# Patient Record
Sex: Female | Born: 1935 | Race: White | Hispanic: No | Smoking: Current every day smoker
Health system: Southern US, Community
[De-identification: ages and names within clinical notes are randomized; demographics above are authoritative.]

## PROBLEM LIST (undated history)

## (undated) DIAGNOSIS — L97909 Non-pressure chronic ulcer of unspecified part of unspecified lower leg with unspecified severity: Secondary | ICD-10-CM

## (undated) DIAGNOSIS — M1990 Unspecified osteoarthritis, unspecified site: Secondary | ICD-10-CM

## (undated) DIAGNOSIS — I70209 Unspecified atherosclerosis of native arteries of extremities, unspecified extremity: Secondary | ICD-10-CM

## (undated) DIAGNOSIS — Z720 Tobacco use: Secondary | ICD-10-CM

## (undated) DIAGNOSIS — L98499 Non-pressure chronic ulcer of skin of other sites with unspecified severity: Secondary | ICD-10-CM

## (undated) DIAGNOSIS — F0390 Unspecified dementia without behavioral disturbance: Secondary | ICD-10-CM

## (undated) DIAGNOSIS — I872 Venous insufficiency (chronic) (peripheral): Secondary | ICD-10-CM

## (undated) HISTORY — PX: ABDOMINAL HYSTERECTOMY: SHX81

## (undated) HISTORY — DX: Unspecified osteoarthritis, unspecified site: M19.90

## (undated) HISTORY — PX: FRACTURE SURGERY: SHX138

---

## 2012-06-07 DIAGNOSIS — Z23 Encounter for immunization: Secondary | ICD-10-CM | POA: Diagnosis not present

## 2013-05-22 DIAGNOSIS — Z23 Encounter for immunization: Secondary | ICD-10-CM | POA: Diagnosis not present

## 2014-03-10 ENCOUNTER — Other Ambulatory Visit: Payer: Self-pay | Admitting: *Deleted

## 2014-03-10 ENCOUNTER — Other Ambulatory Visit (HOSPITAL_COMMUNITY): Payer: Self-pay | Admitting: Internal Medicine

## 2014-03-10 ENCOUNTER — Ambulatory Visit (HOSPITAL_COMMUNITY)
Admission: RE | Admit: 2014-03-10 | Discharge: 2014-03-10 | Disposition: A | Discharge status: Home / Self Care | Payer: Medicare Other | Source: Ambulatory Visit | Attending: Vascular Surgery | Admitting: Vascular Surgery

## 2014-03-10 DIAGNOSIS — R21 Rash and other nonspecific skin eruption: Secondary | ICD-10-CM | POA: Diagnosis not present

## 2014-03-10 DIAGNOSIS — M79609 Pain in unspecified limb: Secondary | ICD-10-CM | POA: Insufficient documentation

## 2014-03-10 DIAGNOSIS — R0609 Other forms of dyspnea: Secondary | ICD-10-CM | POA: Diagnosis not present

## 2014-03-10 DIAGNOSIS — L0291 Cutaneous abscess, unspecified: Secondary | ICD-10-CM | POA: Diagnosis not present

## 2014-03-10 DIAGNOSIS — I739 Peripheral vascular disease, unspecified: Secondary | ICD-10-CM

## 2014-03-10 DIAGNOSIS — Z681 Body mass index (BMI) 19 or less, adult: Secondary | ICD-10-CM | POA: Diagnosis not present

## 2014-03-10 DIAGNOSIS — Z1331 Encounter for screening for depression: Secondary | ICD-10-CM | POA: Diagnosis not present

## 2014-03-10 DIAGNOSIS — R609 Edema, unspecified: Secondary | ICD-10-CM | POA: Diagnosis not present

## 2014-03-10 DIAGNOSIS — M25579 Pain in unspecified ankle and joints of unspecified foot: Secondary | ICD-10-CM | POA: Diagnosis not present

## 2014-03-10 NOTE — Progress Notes (Signed)
Preliminary ankle brachial index results given to Yolanda Martin at [facility], per Dr. Link SnufferHolwerda patient was informed they are to take one baby asprin a day and their office would be contacting them.

## 2014-03-11 ENCOUNTER — Other Ambulatory Visit (HOSPITAL_COMMUNITY): Payer: Self-pay | Admitting: Internal Medicine

## 2014-03-11 ENCOUNTER — Encounter: Payer: Self-pay | Admitting: Vascular Surgery

## 2014-03-11 ENCOUNTER — Ambulatory Visit (HOSPITAL_COMMUNITY): Payer: Self-pay

## 2014-03-11 ENCOUNTER — Other Ambulatory Visit: Payer: Self-pay

## 2014-03-11 ENCOUNTER — Ambulatory Visit (HOSPITAL_COMMUNITY)
Admission: RE | Admit: 2014-03-11 | Discharge: 2014-03-11 | Disposition: A | Discharge status: Home / Self Care | Payer: Medicare Other | Source: Ambulatory Visit | Attending: Vascular Surgery | Admitting: Vascular Surgery

## 2014-03-11 ENCOUNTER — Ambulatory Visit (INDEPENDENT_AMBULATORY_CARE_PROVIDER_SITE_OTHER): Payer: Medicare Other | Admitting: Vascular Surgery

## 2014-03-11 VITALS — BP 196/64 | HR 62 | Temp 98.2°F | Resp 16 | Ht 65.0 in | Wt 99.0 lb

## 2014-03-11 DIAGNOSIS — R609 Edema, unspecified: Secondary | ICD-10-CM

## 2014-03-11 DIAGNOSIS — I739 Peripheral vascular disease, unspecified: Secondary | ICD-10-CM | POA: Diagnosis not present

## 2014-03-11 DIAGNOSIS — L97909 Non-pressure chronic ulcer of unspecified part of unspecified lower leg with unspecified severity: Secondary | ICD-10-CM | POA: Diagnosis not present

## 2014-03-11 DIAGNOSIS — I70209 Unspecified atherosclerosis of native arteries of extremities, unspecified extremity: Secondary | ICD-10-CM | POA: Insufficient documentation

## 2014-03-11 DIAGNOSIS — L98499 Non-pressure chronic ulcer of skin of other sites with unspecified severity: Secondary | ICD-10-CM | POA: Diagnosis not present

## 2014-03-11 DIAGNOSIS — L97322 Non-pressure chronic ulcer of left ankle with fat layer exposed: Secondary | ICD-10-CM | POA: Insufficient documentation

## 2014-03-11 MED ORDER — OXYCODONE-ACETAMINOPHEN 5-325 MG PO TABS: 1.0000 | ORAL_TABLET | ORAL | Status: DC | PRN

## 2014-03-11 NOTE — Assessment & Plan Note (Signed)
This patient has 2 wounds on her left leg and evidence of significant infrainguinal arterial occlusive disease. She continues to smoke. I have had a long discussion with her about the importance of tobacco cessation. He understands that this is a risk factor for peripheral vascular disease and also affects her healing because of its affect on the microcirculation. Given her vascular disease with a nonhealing ulcer, this could become a limb threatening problem. I have recommended that we proceed with arteriography which has been scheduled for 03/16/2014. I have reviewed with the patient the indications for arteriography. In addition, I have reviewed the potential complications of arteriography including but not limited to: Bleeding, arterial injury, arterial thrombosis, dye action, renal insufficiency, or other unpredictable medical problems. I have explained to the patient that if we find disease amenable to angioplasty we could potentially address this at the same time. I have discussed the potential complications of angioplasty and stenting, including but not limited to: Bleeding, arterial thrombosis, arterial injury, dissection, or the need for surgical intervention. I will make further recommendations pending the results. In the meantime she will continue her dressing changes to the left leg. I have given her a prescription for 30 oxycodone for pain.

## 2014-03-11 NOTE — Progress Notes (Signed)
Patient ID: Yolanda QuintBetty R Llerenas, female   DOB: 1933-10-19, 80 y.o.   MRN: [id number]  Reason for Consult: PVD   Referred by Alysia PennaHolwerda, Scott, MD  Subjective:     HPI:  Yolanda Martin is a 80 y.o. female who is a very poor historian. Apparently she developed some small wounds on her left leg although it is not clear how long these have been there. They have likely been there for at least several weeks. She was sent for an arterial Doppler study which showed evidence of significant peripheral vascular disease on the left and therefore was added on as a vascular consult today.  I do not get any clear-cut history of claudication, rest pain, or previous nonhealing ulcers. She does not remember any specific injury to the left leg. She also developed a small wound on her right leg. She denies any history of DVT or phlebitis. She was just recently started on aspirin.  She denies any history of diabetes, hypertension, hypercholesterolemia, history of previous myocardial infarction, or history of congestive heart failure. She states that she's had brothers and sisters with heart disease. She does continue to smoke a pack per day has been smoking for 40 years.  Past Medical History  Diagnosis Date  . Arthritis    Family History  Problem Relation Age of Onset  . Cancer Mother   . Heart disease Mother   . Cancer Father   . Cancer Daughter    Past Surgical History  Procedure Laterality Date  . Abdominal hysterectomy     Short Social History:  History  Substance Use Topics  . Smoking status: Current Every Day Smoker -- 1.00 packs/day    Types: Cigarettes  . Smokeless tobacco: Not on file  . Alcohol Use: No   No Known Allergies  Current Outpatient Prescriptions  Medication Sig Dispense Refill  . aspirin 81 MG tablet Take 81 mg by mouth daily.      Marland Kitchen. oxyCODONE-acetaminophen (ROXICET) 5-325 MG per tablet Take 1-2 tablets by mouth every 4 (four) hours as needed for severe pain.  30 tablet  0    No current facility-administered medications for this visit.   Review of Systems  Constitutional: Negative for chills and fever.  Eyes: Negative for loss of vision.  Respiratory: Negative for cough and wheezing.  Cardiovascular: Negative for chest pain, chest tightness, claudication, dyspnea with exertion, orthopnea and palpitations.  GI: Negative for blood in stool and vomiting.  GU: Negative for dysuria and hematuria.  Musculoskeletal: Negative for leg pain, joint pain and myalgias.  Skin: Negative for rash and wound.  Neurological: Negative for dizziness and speech difficulty.  Hematologic: Negative for bruises/bleeds easily. Psychiatric: Negative for depressed mood.       Objective:  Objective  Filed Vitals:   03/11/14 1152  BP: 196/64  Pulse: 62  Temp: 98.2 F (36.8 C)  TempSrc: Oral  Resp: 16  Height: 5\' 5"  (1.651 m)  Weight: 99 lb (44.906 kg)  SpO2: 97%   Body mass index is 16.47 kg/(m^2).  Physical Exam  Constitutional: She is oriented to person, place, and time. She appears well-developed and well-nourished.  HENT:  Head: Normocephalic and atraumatic.  Neck: Neck supple. No JVD present. No thyromegaly present.  Cardiovascular: Normal rate, regular rhythm and normal heart sounds.  Exam reveals no friction rub.   No murmur heard. Pulses:      Femoral pulses are 2+ on the right side, and 2+ on the left side.  Popliteal pulses are 0 on the right side, and 0 on the left side.       Dorsalis pedis pulses are 0 on the right side, and 0 on the left side.       Posterior tibial pulses are 2+ on the right side, and 0 on the left side.  Pulmonary/Chest: Breath sounds normal. She has no wheezes. She has no rales.  Abdominal: Soft. Bowel sounds are normal. There is no tenderness.  I do not palpate an aneurysm.  Musculoskeletal: Normal range of motion. She exhibits no edema.  Lymphadenopathy:    She has no cervical adenopathy.  Neurological: She is alert and  oriented to person, place, and time. She has normal strength. No sensory deficit.  Skin: No lesion and no rash noted.  She has 2 small wounds on her left leg anteriorly. One measures 5 mm in diameter. The other measures 10 mm in diameter. She has mild bilateral lower extremity swelling and mild cellulitis. On the right leg she has 1 small wound that measures 5 mm in maximum diameter on the anterior leg.  Psychiatric: She has a normal mood and affect.   Data: I have independently interpreted her arterial Doppler study which was done in our office on 03/10/2014. Ankle-brachial index on the right was 100%. Gen. Triphasic posterior tibial signal and a biphasic dorsalis pedis signal. Toe pressure on the right was 118 mmHg.  On the left side, which is the symptomatic side, she has a monophasic dorsalis pedis and posterior tibial signal. ABI on the left is 56%. Toe pressure on the left is 68 mmHg.  Duplex of the left lower extremity showed a short segment occlusion of her left superficial femoral artery.      Assessment/Plan:     Atherosclerotic PVD with ulceration This patient has 2 wounds on her left leg and evidence of significant infrainguinal arterial occlusive disease. She continues to smoke. I have had a long discussion with her about the importance of tobacco cessation. He understands that this is a risk factor for peripheral vascular disease and also affects her healing because of its affect on the microcirculation. Given her vascular disease with a nonhealing ulcer, this could become a limb threatening problem. I have recommended that we proceed with arteriography which has been scheduled for 03/16/2014. I have reviewed with the patient the indications for arteriography. In addition, I have reviewed the potential complications of arteriography including but not limited to: Bleeding, arterial injury, arterial thrombosis, dye action, renal insufficiency, or other unpredictable medical problems. I  have explained to the patient that if we find disease amenable to angioplasty we could potentially address this at the same time. I have discussed the potential complications of angioplasty and stenting, including but not limited to: Bleeding, arterial thrombosis, arterial injury, dissection, or the need for surgical intervention. I will make further recommendations pending the results. In the meantime she will continue her dressing changes to the left leg. I have given her a prescription for 30 oxycodone for pain.      Chuck Hinthristopher S Dickson MD [facility]

## 2014-03-12 ENCOUNTER — Encounter (HOSPITAL_COMMUNITY): Payer: Self-pay | Admitting: Pharmacy Technician

## 2014-03-15 MED ORDER — SODIUM CHLORIDE 0.9 % IV SOLN
INTRAVENOUS | Status: DC
  Administered 2014-03-16: 07:00:00 via INTRAVENOUS

## 2014-03-16 ENCOUNTER — Ambulatory Visit (HOSPITAL_COMMUNITY)
Admission: RE | Admit: 2014-03-16 | Discharge: 2014-03-16 | Disposition: A | Discharge status: Home / Self Care | Payer: Medicare Other | Source: Ambulatory Visit | Attending: Vascular Surgery | Admitting: Vascular Surgery

## 2014-03-16 ENCOUNTER — Encounter (HOSPITAL_COMMUNITY)
Admission: RE | Disposition: A | Discharge status: Home / Self Care | Payer: Self-pay | Source: Ambulatory Visit | Attending: Vascular Surgery

## 2014-03-16 DIAGNOSIS — Z538 Procedure and treatment not carried out for other reasons: Secondary | ICD-10-CM | POA: Diagnosis not present

## 2014-03-16 LAB — POCT I-STAT, CHEM 8
BUN: 14 mg/dL (ref 6–23)
CALCIUM ION: 1.18 mmol/L (ref 1.13–1.30)
CREATININE: 0.8 mg/dL (ref 0.50–1.10)
Chloride: 101 mEq/L (ref 96–112)
GLUCOSE: 114 mg/dL — AB (ref 70–99)
HCT: 39 % (ref 36.0–46.0)
HEMOGLOBIN: 13.3 g/dL (ref 12.0–15.0)
Potassium: 3.3 mEq/L — ABNORMAL LOW (ref 3.7–5.3)
Sodium: 141 mEq/L (ref 137–147)
TCO2: 25 mmol/L (ref 0–100)

## 2014-03-16 SURGERY — ABDOMINAL AORTAGRAM
Anesthesia: LOCAL

## 2014-03-16 MED ORDER — HYDRALAZINE HCL 20 MG/ML IJ SOLN
10.0000 mg | Freq: Once | INTRAMUSCULAR | Status: AC
  Administered 2014-03-16: 10 mg via INTRAVENOUS

## 2014-03-16 MED ORDER — POTASSIUM CHLORIDE CRYS ER 20 MEQ PO TBCR
EXTENDED_RELEASE_TABLET | ORAL | Status: AC
  Filled 2014-03-16: qty 1

## 2014-03-16 MED ORDER — POTASSIUM CHLORIDE CRYS ER 20 MEQ PO TBCR
20.0000 meq | EXTENDED_RELEASE_TABLET | Freq: Once | ORAL | Status: AC
  Administered 2014-03-16: 20 meq via ORAL

## 2014-03-16 MED ORDER — HYDRALAZINE HCL 20 MG/ML IJ SOLN
INTRAMUSCULAR | Status: AC
  Filled 2014-03-16: qty 1

## 2014-03-18 ENCOUNTER — Telehealth: Payer: Self-pay

## 2014-03-18 DIAGNOSIS — E785 Hyperlipidemia, unspecified: Secondary | ICD-10-CM | POA: Diagnosis not present

## 2014-03-18 DIAGNOSIS — I10 Essential (primary) hypertension: Secondary | ICD-10-CM | POA: Diagnosis not present

## 2014-03-18 DIAGNOSIS — R609 Edema, unspecified: Secondary | ICD-10-CM | POA: Diagnosis not present

## 2014-03-18 NOTE — Telephone Encounter (Signed)
My first case Yolanda Martin was canceled because her blood pressure is poorly controlled. Systolic pressure was 190 despite attempts to correct this. She also complaining of some shortness of breath. Her wounds in the left leg are stable. For this reason she will need to [facility] up [name]. All she can remember is that his first name is Acupuncturistcott. When she is medically stable we can reschedule her for her arteriogram and possible PTA stent. Thank you.

## 2014-03-24 ENCOUNTER — Other Ambulatory Visit: Payer: Self-pay | Admitting: Internal Medicine

## 2014-03-24 DIAGNOSIS — Z1231 Encounter for screening mammogram for malignant neoplasm of breast: Secondary | ICD-10-CM

## 2014-03-24 DIAGNOSIS — R0609 Other forms of dyspnea: Secondary | ICD-10-CM | POA: Diagnosis not present

## 2014-03-24 DIAGNOSIS — F17200 Nicotine dependence, unspecified, uncomplicated: Secondary | ICD-10-CM | POA: Diagnosis not present

## 2014-03-24 DIAGNOSIS — L97909 Non-pressure chronic ulcer of unspecified part of unspecified lower leg with unspecified severity: Secondary | ICD-10-CM | POA: Diagnosis not present

## 2014-03-24 DIAGNOSIS — R609 Edema, unspecified: Secondary | ICD-10-CM | POA: Diagnosis not present

## 2014-03-24 DIAGNOSIS — R0989 Other specified symptoms and signs involving the circulatory and respiratory systems: Secondary | ICD-10-CM | POA: Diagnosis not present

## 2014-03-24 DIAGNOSIS — Z681 Body mass index (BMI) 19 or less, adult: Secondary | ICD-10-CM | POA: Diagnosis not present

## 2014-03-24 DIAGNOSIS — I10 Essential (primary) hypertension: Secondary | ICD-10-CM | POA: Diagnosis not present

## 2014-03-24 DIAGNOSIS — E2839 Other primary ovarian failure: Secondary | ICD-10-CM

## 2014-03-24 DIAGNOSIS — E785 Hyperlipidemia, unspecified: Secondary | ICD-10-CM | POA: Diagnosis not present

## 2014-03-24 DIAGNOSIS — I739 Peripheral vascular disease, unspecified: Secondary | ICD-10-CM | POA: Diagnosis not present

## 2014-03-25 ENCOUNTER — Other Ambulatory Visit: Payer: Self-pay | Admitting: *Deleted

## 2014-04-01 ENCOUNTER — Ambulatory Visit (HOSPITAL_COMMUNITY)
Admission: RE | Admit: 2014-04-01 | Discharge: 2014-04-01 | Disposition: A | Discharge status: Home / Self Care | Payer: Medicare Other | Source: Ambulatory Visit | Attending: Cardiology | Admitting: Cardiology

## 2014-04-01 DIAGNOSIS — R609 Edema, unspecified: Secondary | ICD-10-CM | POA: Diagnosis not present

## 2014-04-01 DIAGNOSIS — I059 Rheumatic mitral valve disease, unspecified: Secondary | ICD-10-CM

## 2014-04-01 NOTE — Progress Notes (Signed)
2D Echocardiogram Complete.  04/01/2014   Royalty Fakhouri, RDCS  

## 2014-04-05 MED ORDER — SODIUM CHLORIDE 0.9 % IV SOLN
INTRAVENOUS | Status: DC
  Administered 2014-04-06: 08:00:00 via INTRAVENOUS

## 2014-04-06 ENCOUNTER — Ambulatory Visit (HOSPITAL_COMMUNITY)
Admission: RE | Admit: 2014-04-06 | Discharge: 2014-04-06 | Disposition: A | Discharge status: Home / Self Care | Payer: Medicare Other | Source: Ambulatory Visit | Attending: Vascular Surgery | Admitting: Vascular Surgery

## 2014-04-06 ENCOUNTER — Ambulatory Visit (HOSPITAL_COMMUNITY): Payer: Medicare Other

## 2014-04-06 ENCOUNTER — Encounter (HOSPITAL_COMMUNITY)
Admission: RE | Disposition: A | Discharge status: Home / Self Care | Payer: Self-pay | Source: Ambulatory Visit | Attending: Vascular Surgery

## 2014-04-06 DIAGNOSIS — S79929A Unspecified injury of unspecified thigh, initial encounter: Secondary | ICD-10-CM | POA: Diagnosis not present

## 2014-04-06 DIAGNOSIS — Y92009 Unspecified place in unspecified non-institutional (private) residence as the place of occurrence of the external cause: Secondary | ICD-10-CM | POA: Insufficient documentation

## 2014-04-06 DIAGNOSIS — M25559 Pain in unspecified hip: Secondary | ICD-10-CM | POA: Insufficient documentation

## 2014-04-06 DIAGNOSIS — I739 Peripheral vascular disease, unspecified: Secondary | ICD-10-CM | POA: Insufficient documentation

## 2014-04-06 DIAGNOSIS — F17200 Nicotine dependence, unspecified, uncomplicated: Secondary | ICD-10-CM | POA: Insufficient documentation

## 2014-04-06 DIAGNOSIS — S79919A Unspecified injury of unspecified hip, initial encounter: Secondary | ICD-10-CM | POA: Diagnosis not present

## 2014-04-06 DIAGNOSIS — Z7982 Long term (current) use of aspirin: Secondary | ICD-10-CM | POA: Insufficient documentation

## 2014-04-06 DIAGNOSIS — W19XXXA Unspecified fall, initial encounter: Secondary | ICD-10-CM | POA: Diagnosis not present

## 2014-04-06 DIAGNOSIS — S12100A Unspecified displaced fracture of second cervical vertebra, initial encounter for closed fracture: Secondary | ICD-10-CM | POA: Insufficient documentation

## 2014-04-06 DIAGNOSIS — L98499 Non-pressure chronic ulcer of skin of other sites with unspecified severity: Principal | ICD-10-CM | POA: Insufficient documentation

## 2014-04-06 DIAGNOSIS — L97809 Non-pressure chronic ulcer of other part of unspecified lower leg with unspecified severity: Secondary | ICD-10-CM | POA: Diagnosis not present

## 2014-04-06 HISTORY — PX: ABDOMINAL AORTAGRAM: SHX5454

## 2014-04-06 LAB — POCT I-STAT, CHEM 8
BUN: 11 mg/dL (ref 6–23)
CALCIUM ION: 1.17 mmol/L (ref 1.13–1.30)
CREATININE: 0.7 mg/dL (ref 0.50–1.10)
Chloride: 100 mEq/L (ref 96–112)
GLUCOSE: 118 mg/dL — AB (ref 70–99)
HCT: 42 % (ref 36.0–46.0)
HEMOGLOBIN: 14.3 g/dL (ref 12.0–15.0)
Potassium: 3.4 mEq/L — ABNORMAL LOW (ref 3.7–5.3)
Sodium: 141 mEq/L (ref 137–147)
TCO2: 24 mmol/L (ref 0–100)

## 2014-04-06 SURGERY — ABDOMINAL AORTAGRAM
Anesthesia: LOCAL

## 2014-04-06 MED ORDER — LIDOCAINE HCL (PF) 1 % IJ SOLN
INTRAMUSCULAR | Status: AC
  Filled 2014-04-06: qty 30

## 2014-04-06 MED ORDER — SODIUM CHLORIDE 0.9 % IV SOLN: 1.0000 mL/kg/h | INTRAVENOUS | Status: DC

## 2014-04-06 MED ORDER — HYDRALAZINE HCL 20 MG/ML IJ SOLN: 10.0000 mg | INTRAMUSCULAR | Status: DC | PRN

## 2014-04-06 MED ORDER — HYDRALAZINE HCL 20 MG/ML IJ SOLN
INTRAMUSCULAR | Status: AC
  Filled 2014-04-06: qty 1

## 2014-04-06 MED ORDER — HEPARIN (PORCINE) IN NACL 2-0.9 UNIT/ML-% IJ SOLN
INTRAMUSCULAR | Status: AC
  Filled 2014-04-06: qty 1000

## 2014-04-06 NOTE — Op Note (Signed)
   PATIENT: Yolanda QuintBetty R Martin   MRN: [id number] DOB: 22-Sep-1933    DATE OF PROCEDURE: 04/06/2014  INDICATIONS: Yolanda Martin is a 80 y.o. female Who has 2 small wounds on her left leg anteriorly. By duplex she was found to have a short segment occlusion of the superficial femoral artery. She presents for arteriography and possible angioplasty and stenting.  PROCEDURE:  1. Ultrasound-guided access to the right common femoral artery 2. Aortogram bilateral iliac arteriogram  3. Selective catheterization of the left external iliac artery with left lower extremity runoff 4. Retrograde right femoral arteriogram with right lower extremity runoff  SURGEON: Di Kindlehristopher S. Edilia Boickson, MD, FACS  ANESTHESIA: local   EBL: minimal  TECHNIQUE: Patient was taken to the peripheral vascular lab. Both groins were prepped and draped in usual sterile fashion. Under ultrasound guidance, after the skin was anesthetized, the right common femoral artery was cannulated with a micropuncture needle and a micropuncture sheath introduced over a wire. This was then exchanged for a 5 [place] sheath over a [employer] wire. A pigtail catheter was positioned at the L1 vertebral body and flush aortogram obtained. The catheter was positioned above the aortic bifurcation in an oblique iliac projection was obtained. This catheter was exchanged for a crossover catheter which was positioned into the left common iliac artery. The wire was advanced into the external iliac artery and then the crossover catheter exchanged for an endhole catheter. Selective left external iliac arteriogram was obtained with left lower extremity runoff. This catheter was then removed and a retrograde right femoral arteriogram was obtained with right lower extremity runoff. At the completion, the patient was transferred to short stay for removal of the sheath.  FINDINGS:  1. Single renal arteries bilaterally with no significant renal artery stenosis. 2. Mild diffuse  disease of the infrarenal aorta and bilateral common iliac arteries but no focal stenosis. 3. On the left side, the external iliac, common femoral, deep femoral, and superficial femoral arteries are patent. 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 There are extensive collaterals present. The below-knee popliteal artery is patent with three-vessel runoff on the left via the anterior tibial, posterior tibial, and peroneal arteries. 4. On the right side, the external iliac, deep femoral, superficial femoral, popliteal, anterior tibial, posterior tibial, and peroneal arteries are patent.  CLINICAL NOTE: Face on the arteriogram results the patient appears to have adequate circulation to heal the wounds on the left leg. If these do failed to heal she could potentially have angioplasty and stenting of her popliteal occlusion although this would be associated with some risk given the proximity to the knee. The stenosis is 4 cm in length.  Waverly Ferrarihristopher Dickson, MD, FACS [facility]  DATE OF DICTATION:   04/06/2014

## 2014-04-06 NOTE — Progress Notes (Signed)
Patient ID: Georgina QuintBetty R Hass, female   DOB: September 15, 1933, 80 y.o.   MRN: [id number] The patient had complained of neck and back and hip pain following the procedure. She related a fall at home several weeks ago. Dr. Edilia Boickson ordered cervical spine and hip films. Hip films showed no evidence of problems. Her cervical spine films showed small spinal fracture was some subluxation. The patient currently is sitting in a wheelchair with her family in the short stay. I discussed this finding with them. She reports that she fell and struck her head and neck was some soreness since this time.   I discussed these events with Dr. Danielle DessElsner for neurosurgical consultation. I have ordered a stat noncontrast cervical CT scan for further evaluation. Disposition is pending Dr. Verlee RossettiElsner's  evaluation .

## 2014-04-06 NOTE — Discharge Instructions (Signed)

## 2014-04-06 NOTE — Progress Notes (Signed)
Orthopedic Tech Progress Note Patient Details:  Yolanda Martin 1933-12-16 [id number]  Ortho Devices Type of Ortho Device: Soft collar Ortho Device/Splint Interventions: Application   Asia R Thompson 04/06/2014, 4:55 PM

## 2014-04-06 NOTE — Progress Notes (Addendum)
Order for sheath removal verified per post procedural orders. Procedure explained to patient and Rt femoral artery access site assessed: level 0, doppler dorsalis pedis and posterior tibial pulses.  5 [place] Sheath removed and manual pressure applied for 20  minutes. Pre, peri, & post procedural vitals: HR  , RR 75, O2 Sat  94%  RA BP 131/60, Pain 0. Distal pulses remained intact after sheath removal. Access site level 0 and dressed with 4X4 gauze and tegaderm.   Shalo RN confirmed condition of site. Post procedural instructions discussed with return demonstration from patient.

## 2014-04-06 NOTE — Interval H&P Note (Signed)
History and Physical Interval Note:  04/06/2014 8:36 AM  Yolanda Martin  has presented today for surgery, with the diagnosis of pvd  The various methods of treatment have been discussed with the patient and family. After consideration of risks, benefits and other options for treatment, the patient has consented to  Procedure(s): ABDOMINAL AORTAGRAM (N/A) as a surgical intervention .  The patient's history has been reviewed, patient examined, no change in status, stable for surgery.  I have reviewed the patient's chart and labs.  Questions were answered to the patient's satisfaction.     Samiel Peel S

## 2014-04-06 NOTE — Progress Notes (Signed)
Pt c/o SOB, difficulty swallowing, lungs clear,  O2 sats 97 on RA.  Dr Edilia Boickson notified will be in to access pt.

## 2014-04-06 NOTE — H&P (View-Only) (Signed)
Patient ID: Yolanda Martin, female   DOB: 1933-10-19, 80 y.o.   MRN: [id number]  Reason for Consult: PVD   Referred by Alysia PennaHolwerda, Scott, MD  Subjective:     HPI:  Yolanda QuintBetty R Depaoli is a 80 y.o. female who is a very poor historian. Apparently she developed some small wounds on her left leg although it is not clear how long these have been there. They have likely been there for at least several weeks. She was sent for an arterial Doppler study which showed evidence of significant peripheral vascular disease on the left and therefore was added on as a vascular consult today.  I do not get any clear-cut history of claudication, rest pain, or previous nonhealing ulcers. She does not remember any specific injury to the left leg. She also developed a small wound on her right leg. She denies any history of DVT or phlebitis. She was just recently started on aspirin.  She denies any history of diabetes, hypertension, hypercholesterolemia, history of previous myocardial infarction, or history of congestive heart failure. She states that she's had brothers and sisters with heart disease. She does continue to smoke a pack per day has been smoking for 40 years.  Past Medical History  Diagnosis Date  . Arthritis    Family History  Problem Relation Age of Onset  . Cancer Mother   . Heart disease Mother   . Cancer Father   . Cancer Daughter    Past Surgical History  Procedure Laterality Date  . Abdominal hysterectomy     Short Social History:  History  Substance Use Topics  . Smoking status: Current Every Day Smoker -- 1.00 packs/day    Types: Cigarettes  . Smokeless tobacco: Not on file  . Alcohol Use: No   No Known Allergies  Current Outpatient Prescriptions  Medication Sig Dispense Refill  . aspirin 81 MG tablet Take 81 mg by mouth daily.      Marland Kitchen. oxyCODONE-acetaminophen (ROXICET) 5-325 MG per tablet Take 1-2 tablets by mouth every 4 (four) hours as needed for severe pain.  30 tablet  0    No current facility-administered medications for this visit.   Review of Systems  Constitutional: Negative for chills and fever.  Eyes: Negative for loss of vision.  Respiratory: Negative for cough and wheezing.  Cardiovascular: Negative for chest pain, chest tightness, claudication, dyspnea with exertion, orthopnea and palpitations.  GI: Negative for blood in stool and vomiting.  GU: Negative for dysuria and hematuria.  Musculoskeletal: Negative for leg pain, joint pain and myalgias.  Skin: Negative for rash and wound.  Neurological: Negative for dizziness and speech difficulty.  Hematologic: Negative for bruises/bleeds easily. Psychiatric: Negative for depressed mood.       Objective:  Objective  Filed Vitals:   03/11/14 1152  BP: 196/64  Pulse: 62  Temp: 98.2 F (36.8 C)  TempSrc: Oral  Resp: 16  Height: 5\' 5"  (1.651 m)  Weight: 99 lb (44.906 kg)  SpO2: 97%   Body mass index is 16.47 kg/(m^2).  Physical Exam  Constitutional: She is oriented to person, place, and time. She appears well-developed and well-nourished.  HENT:  Head: Normocephalic and atraumatic.  Neck: Neck supple. No JVD present. No thyromegaly present.  Cardiovascular: Normal rate, regular rhythm and normal heart sounds.  Exam reveals no friction rub.   No murmur heard. Pulses:      Femoral pulses are 2+ on the right side, and 2+ on the left side.  Popliteal pulses are 0 on the right side, and 0 on the left side.       Dorsalis pedis pulses are 0 on the right side, and 0 on the left side.       Posterior tibial pulses are 2+ on the right side, and 0 on the left side.  Pulmonary/Chest: Breath sounds normal. She has no wheezes. She has no rales.  Abdominal: Soft. Bowel sounds are normal. There is no tenderness.  I do not palpate an aneurysm.  Musculoskeletal: Normal range of motion. She exhibits no edema.  Lymphadenopathy:    She has no cervical adenopathy.  Neurological: She is alert and  oriented to person, place, and time. She has normal strength. No sensory deficit.  Skin: No lesion and no rash noted.  She has 2 small wounds on her left leg anteriorly. One measures 5 mm in diameter. The other measures 10 mm in diameter. She has mild bilateral lower extremity swelling and mild cellulitis. On the right leg she has 1 small wound that measures 5 mm in maximum diameter on the anterior leg.  Psychiatric: She has a normal mood and affect.   Data: I have independently interpreted her arterial Doppler study which was done in our office on 03/10/2014. Ankle-brachial index on the right was 100%. Gen. Triphasic posterior tibial signal and a biphasic dorsalis pedis signal. Toe pressure on the right was 118 mmHg.  On the left side, which is the symptomatic side, she has a monophasic dorsalis pedis and posterior tibial signal. ABI on the left is 56%. Toe pressure on the left is 68 mmHg.  Duplex of the left lower extremity showed a short segment occlusion of her left superficial femoral artery.      Assessment/Plan:     Atherosclerotic PVD with ulceration This patient has 2 wounds on her left leg and evidence of significant infrainguinal arterial occlusive disease. She continues to smoke. I have had a long discussion with her about the importance of tobacco cessation. He understands that this is a risk factor for peripheral vascular disease and also affects her healing because of its affect on the microcirculation. Given her vascular disease with a nonhealing ulcer, this could become a limb threatening problem. I have recommended that we proceed with arteriography which has been scheduled for 03/16/2014. I have reviewed with the patient the indications for arteriography. In addition, I have reviewed the potential complications of arteriography including but not limited to: Bleeding, arterial injury, arterial thrombosis, dye action, renal insufficiency, or other unpredictable medical problems. I  have explained to the patient that if we find disease amenable to angioplasty we could potentially address this at the same time. I have discussed the potential complications of angioplasty and stenting, including but not limited to: Bleeding, arterial thrombosis, arterial injury, dissection, or the need for surgical intervention. I will make further recommendations pending the results. In the meantime she will continue her dressing changes to the left leg. I have given her a prescription for 30 oxycodone for pain.      Chuck Hinthristopher S Oris Staffieri MD [facility]

## 2014-04-06 NOTE — Consult Note (Signed)
Reason for Consult: Neck pain question fracture C2 Referring Physician: Dr. Tawanna Cooler early  Yolanda Martin is an 80 y.o. female.  HPI: Yolanda Martin is a 80 year old individual who would come in for a vascular workup. She apparently sustained a fall sometime before this admission and was complaining of neck pain and body aches in various parts of her body. A C-spine x-ray was ordered because of the neck pain.  When I'm seeing the patient she seems to be down playing the neck pain and the family confirms that the patient's pain and aches are all old complaints. The plane x-rays of the cervical spine however show that there is a type II odontoid fracture with about 6 mm of anterior displacement. There is also a subluxation at C5-C6. In discussing the patient's situation she denies any numbness or tingling into the arms or hands she denies any new weakness beyond what she experiences chronically  Past Medical History  Diagnosis Date  . Arthritis     Past Surgical History  Procedure Laterality Date  . Abdominal hysterectomy      Family History  Problem Relation Age of Onset  . Cancer Mother   . Heart disease Mother   . Cancer Father   . Cancer Daughter     Social History:  reports that she has been smoking Cigarettes.  She has been smoking about 1.00 pack per day. She does not have any smokeless tobacco history on file. She reports that she does not drink alcohol or use illicit drugs.  Allergies: No Known Allergies  Medications: Have not reviewed the patient's medication  Results for orders placed during the hospital encounter of 04/06/14 (from the past 48 hour(s))  POCT I-STAT, CHEM 8     Status: Abnormal   Collection Time    04/06/14  8:25 AM      Result Value Ref Range   Sodium 141  137 - 147 mEq/L   Potassium 3.4 (*) 3.7 - 5.3 mEq/L   Chloride 100  96 - 112 mEq/L   BUN 11  6 - 23 mg/dL   Creatinine, Ser 1.61  0.50 - 1.10 mg/dL   Glucose, Bld 096 (*) 70 - 99 mg/dL   Calcium, Ion 0.45  4.09 - 1.30 mmol/L   TCO2 24  0 - 100 mmol/L   Hemoglobin 14.3  12.0 - 15.0 g/dL   HCT 81.1  91.4 - 78.2 %    Dg Cervical Spine 2 Or 3 Views  04/06/2014   CLINICAL DATA:  Fall. Neck pain particularly on the right. Arthritis.  EXAM: CERVICAL SPINE - 2-3 VIEW  COMPARISON:  None.  FINDINGS: Limited sensitivity and specificity due to positioning difficulties with the patient and bony demineralization.  Type 2 odontoid fracture with 5 mm posterior subluxation of the odontoid with respect to the C2 vertebral body.  3.5 mm anterolisthesis at C5-6, age indeterminate.  Reduced intervertebral disc height and posterior osseous ridging at C6-7.  Biapical branching opacities favoring scarring.  IMPRESSION: 1. Type 2 odontoid fracture (age indeterminate) with 5 mm posterior subluxation of the odontoid with respect to the C2 vertebral body. 2. Grade 1 anterior subluxation at C5-6 could be acute or chronic. 3. CT scan or MRI of the cervical spine is recommended for further characterization ; this two view series is limited by the patient's bony demineralization and difficulty positioning. These results will be called to the ordering clinician or representative by the Radiologist Assistant, and communication documented in the PACS or  zVision Dashboard.   Electronically Signed   By: Herbie BaltimoreWalt  Liebkemann M.D.   On: 04/06/2014 15:26   X-ray Hip Right Ap And Lateral  04/06/2014   CLINICAL DATA:  Status post fall  EXAM: RIGHT HIP - COMPLETE 2+ VIEW  COMPARISON:  None.  FINDINGS: There is no evidence of hip fracture or dislocation. There is no evidence of arthropathy or other focal bone abnormality.  IMPRESSION: Negative.   Electronically Signed   By: Elige KoHetal  Patel   On: 04/06/2014 15:24    Review of Systems  Unable to perform ROS: acuity of condition   Blood pressure 176/78, pulse 77, temperature 97.9 F (36.6 C), temperature source Oral, resp. rate 16, height 5' 5.5" (1.664 m), weight 43.092 kg (95 lb),  SpO2 98.00%. Physical Exam  Constitutional:  Frail-appearing elderly lady  Neurological:  Motor function is intact in both upper extremities. Sensation appears intact in both upper extremities. Range of motion appears fair without pain turning 45 left and right, flexing about 50% of normal.    Assessment/Plan: CT scan demonstrates that the type II odontoid fracture is clearly old. The subluxation also appears old and fixed. I've advised the patient that there are no acute injuries. Soft cervical collar may be used as needed for comfort. No specific followup is required.  Filippa Yarbough J 04/06/2014, 6:54 PM

## 2014-04-06 NOTE — Progress Notes (Signed)
Dr Edilia Boickson in to access pt, orders for hip xray and neck xray placed.  Will continue to monitor

## 2014-04-07 ENCOUNTER — Telehealth: Payer: Self-pay | Admitting: Vascular Surgery

## 2014-04-07 NOTE — Telephone Encounter (Addendum)
Message copied by Fredrich BirksMILLIKAN, DANA P on Tue Apr 07, 2014  1:49 PM ------      Message from: Chuck HintICKSON, CHRISTOPHER S      Created: Mon Apr 06, 2014 12:02 PM      Regarding: wound care Jonita Albeeden       The family is requesting that we get the pt an apt with the [place] care [place] in [place] concerning her left leg wounds.      Thanks      Thayer Ohmhris  ------  04/07/14: spoke with Kathie RhodesBetty to notify her that I had placed a referral [facility]. She had a lot of questions about pricing. I advised her that she would need to speak with their office about pricing when they called to set up her appointment. dpm

## 2014-04-07 NOTE — Telephone Encounter (Addendum)
Message copied by Fredrich BirksMILLIKAN, DANA P on Tue Apr 07, 2014  1:50 PM ------      Message from: Lorin MercyMCCHESNEY, MARILYN K      Created: Mon Apr 06, 2014 10:58 AM      Regarding: Schedule                   ----- Message -----         From: Chuck Hinthristopher S Dickson, MD         Sent: 04/06/2014  10:19 AM           To: Vvs Charge Pool      Subject: charge and follow up                                     PROCEDURE:       1. Ultrasound-guided access to the right common femoral artery      2. Aortogram bilateral iliac arteriogram       3. Selective catheterization of the left external iliac artery with left lower extremity runoff      4. Retrograde right femoral arteriogram with right lower extremity runoff            SURGEON: Di Kindlehristopher S. Edilia Boickson, MD, FACS            She will need a follow up visit in 2 months to check on her left leg wounds. Thank you.CD ------  04/07/14: patient notified of appt, dpm

## 2014-04-21 DIAGNOSIS — L02419 Cutaneous abscess of limb, unspecified: Secondary | ICD-10-CM | POA: Diagnosis not present

## 2014-04-21 DIAGNOSIS — M129 Arthropathy, unspecified: Secondary | ICD-10-CM | POA: Diagnosis not present

## 2014-04-21 DIAGNOSIS — I739 Peripheral vascular disease, unspecified: Secondary | ICD-10-CM | POA: Diagnosis not present

## 2014-04-21 DIAGNOSIS — Z8249 Family history of ischemic heart disease and other diseases of the circulatory system: Secondary | ICD-10-CM | POA: Diagnosis not present

## 2014-04-21 DIAGNOSIS — L97929 Non-pressure chronic ulcer of unspecified part of left lower leg with unspecified severity: Secondary | ICD-10-CM | POA: Diagnosis not present

## 2014-04-21 DIAGNOSIS — L97809 Non-pressure chronic ulcer of other part of unspecified lower leg with unspecified severity: Secondary | ICD-10-CM | POA: Diagnosis not present

## 2014-04-21 DIAGNOSIS — I83219 Varicose veins of right lower extremity with both ulcer of unspecified site and inflammation: Secondary | ICD-10-CM | POA: Diagnosis not present

## 2014-04-21 DIAGNOSIS — I872 Venous insufficiency (chronic) (peripheral): Secondary | ICD-10-CM | POA: Diagnosis not present

## 2014-04-21 DIAGNOSIS — Z7982 Long term (current) use of aspirin: Secondary | ICD-10-CM | POA: Diagnosis not present

## 2014-04-21 DIAGNOSIS — F17200 Nicotine dependence, unspecified, uncomplicated: Secondary | ICD-10-CM | POA: Diagnosis not present

## 2014-04-21 DIAGNOSIS — Z79899 Other long term (current) drug therapy: Secondary | ICD-10-CM | POA: Diagnosis not present

## 2014-04-21 DIAGNOSIS — I10 Essential (primary) hypertension: Secondary | ICD-10-CM | POA: Diagnosis not present

## 2014-04-28 DIAGNOSIS — I872 Venous insufficiency (chronic) (peripheral): Secondary | ICD-10-CM | POA: Diagnosis not present

## 2014-04-28 DIAGNOSIS — L97929 Non-pressure chronic ulcer of unspecified part of left lower leg with unspecified severity: Secondary | ICD-10-CM | POA: Diagnosis not present

## 2014-04-28 DIAGNOSIS — I8390 Asymptomatic varicose veins of unspecified lower extremity: Secondary | ICD-10-CM | POA: Diagnosis not present

## 2014-04-28 DIAGNOSIS — L97809 Non-pressure chronic ulcer of other part of unspecified lower leg with unspecified severity: Secondary | ICD-10-CM | POA: Diagnosis not present

## 2014-04-28 DIAGNOSIS — I83219 Varicose veins of right lower extremity with both ulcer of unspecified site and inflammation: Secondary | ICD-10-CM | POA: Diagnosis not present

## 2014-04-28 DIAGNOSIS — L02419 Cutaneous abscess of limb, unspecified: Secondary | ICD-10-CM | POA: Diagnosis not present

## 2014-04-30 ENCOUNTER — Ambulatory Visit
Admission: RE | Admit: 2014-04-30 | Discharge: 2014-04-30 | Disposition: A | Discharge status: Home / Self Care | Payer: Medicare Other | Source: Ambulatory Visit | Attending: Internal Medicine | Admitting: Internal Medicine

## 2014-04-30 DIAGNOSIS — M810 Age-related osteoporosis without current pathological fracture: Secondary | ICD-10-CM | POA: Diagnosis not present

## 2014-04-30 DIAGNOSIS — Z1231 Encounter for screening mammogram for malignant neoplasm of breast: Secondary | ICD-10-CM | POA: Diagnosis not present

## 2014-04-30 DIAGNOSIS — E2839 Other primary ovarian failure: Secondary | ICD-10-CM

## 2014-05-06 DIAGNOSIS — I87319 Chronic venous hypertension (idiopathic) with ulcer of unspecified lower extremity: Secondary | ICD-10-CM | POA: Diagnosis not present

## 2014-05-06 DIAGNOSIS — L03119 Cellulitis of unspecified part of limb: Secondary | ICD-10-CM | POA: Diagnosis not present

## 2014-05-06 DIAGNOSIS — L97909 Non-pressure chronic ulcer of unspecified part of unspecified lower leg with unspecified severity: Secondary | ICD-10-CM | POA: Diagnosis not present

## 2014-05-06 DIAGNOSIS — I8390 Asymptomatic varicose veins of unspecified lower extremity: Secondary | ICD-10-CM | POA: Diagnosis not present

## 2014-05-06 DIAGNOSIS — L97809 Non-pressure chronic ulcer of other part of unspecified lower leg with unspecified severity: Secondary | ICD-10-CM | POA: Diagnosis not present

## 2014-05-06 DIAGNOSIS — I83009 Varicose veins of unspecified lower extremity with ulcer of unspecified site: Secondary | ICD-10-CM | POA: Diagnosis not present

## 2014-05-06 DIAGNOSIS — I872 Venous insufficiency (chronic) (peripheral): Secondary | ICD-10-CM | POA: Diagnosis not present

## 2014-05-06 DIAGNOSIS — L02419 Cutaneous abscess of limb, unspecified: Secondary | ICD-10-CM | POA: Diagnosis not present

## 2014-05-13 DIAGNOSIS — I8390 Asymptomatic varicose veins of unspecified lower extremity: Secondary | ICD-10-CM | POA: Diagnosis not present

## 2014-05-13 DIAGNOSIS — L97909 Non-pressure chronic ulcer of unspecified part of unspecified lower leg with unspecified severity: Secondary | ICD-10-CM | POA: Diagnosis not present

## 2014-05-13 DIAGNOSIS — L02419 Cutaneous abscess of limb, unspecified: Secondary | ICD-10-CM | POA: Diagnosis not present

## 2014-05-13 DIAGNOSIS — I83009 Varicose veins of unspecified lower extremity with ulcer of unspecified site: Secondary | ICD-10-CM | POA: Diagnosis not present

## 2014-05-13 DIAGNOSIS — I87319 Chronic venous hypertension (idiopathic) with ulcer of unspecified lower extremity: Secondary | ICD-10-CM | POA: Diagnosis not present

## 2014-05-13 DIAGNOSIS — L97809 Non-pressure chronic ulcer of other part of unspecified lower leg with unspecified severity: Secondary | ICD-10-CM | POA: Diagnosis not present

## 2014-05-13 DIAGNOSIS — I872 Venous insufficiency (chronic) (peripheral): Secondary | ICD-10-CM | POA: Diagnosis not present

## 2014-05-20 DIAGNOSIS — L97809 Non-pressure chronic ulcer of other part of unspecified lower leg with unspecified severity: Secondary | ICD-10-CM | POA: Diagnosis not present

## 2014-05-20 DIAGNOSIS — I872 Venous insufficiency (chronic) (peripheral): Secondary | ICD-10-CM | POA: Diagnosis not present

## 2014-05-20 DIAGNOSIS — I8390 Asymptomatic varicose veins of unspecified lower extremity: Secondary | ICD-10-CM | POA: Diagnosis not present

## 2014-05-20 DIAGNOSIS — L02419 Cutaneous abscess of limb, unspecified: Secondary | ICD-10-CM | POA: Diagnosis not present

## 2014-05-28 DIAGNOSIS — I83012 Varicose veins of right lower extremity with ulcer of calf: Secondary | ICD-10-CM | POA: Diagnosis not present

## 2014-05-28 DIAGNOSIS — L97221 Non-pressure chronic ulcer of left calf limited to breakdown of skin: Secondary | ICD-10-CM | POA: Diagnosis not present

## 2014-05-28 DIAGNOSIS — Z09 Encounter for follow-up examination after completed treatment for conditions other than malignant neoplasm: Secondary | ICD-10-CM | POA: Diagnosis not present

## 2014-05-28 DIAGNOSIS — Z872 Personal history of diseases of the skin and subcutaneous tissue: Secondary | ICD-10-CM | POA: Diagnosis not present

## 2014-05-28 DIAGNOSIS — L97211 Non-pressure chronic ulcer of right calf limited to breakdown of skin: Secondary | ICD-10-CM | POA: Diagnosis not present

## 2014-05-28 DIAGNOSIS — I83022 Varicose veins of left lower extremity with ulcer of calf: Secondary | ICD-10-CM | POA: Diagnosis not present

## 2014-06-09 ENCOUNTER — Encounter: Payer: Self-pay | Admitting: Vascular Surgery

## 2014-06-10 ENCOUNTER — Encounter: Payer: Medicare Other | Admitting: Vascular Surgery

## 2014-06-30 ENCOUNTER — Encounter: Payer: Self-pay | Admitting: Vascular Surgery

## 2014-07-01 ENCOUNTER — Ambulatory Visit (INDEPENDENT_AMBULATORY_CARE_PROVIDER_SITE_OTHER): Payer: Medicare Other | Admitting: Vascular Surgery

## 2014-07-01 ENCOUNTER — Encounter: Payer: Self-pay | Admitting: Vascular Surgery

## 2014-07-01 VITALS — BP 194/79 | HR 60 | Ht 65.5 in | Wt 105.3 lb

## 2014-07-01 DIAGNOSIS — I10 Essential (primary) hypertension: Secondary | ICD-10-CM

## 2014-07-01 DIAGNOSIS — I70209 Unspecified atherosclerosis of native arteries of extremities, unspecified extremity: Secondary | ICD-10-CM

## 2014-07-01 DIAGNOSIS — L98499 Non-pressure chronic ulcer of skin of other sites with unspecified severity: Secondary | ICD-10-CM

## 2014-07-01 DIAGNOSIS — I739 Peripheral vascular disease, unspecified: Secondary | ICD-10-CM | POA: Diagnosis not present

## 2014-07-01 NOTE — Progress Notes (Signed)
   Patient name: Yolanda Martin Colonna MRN: [id number] DOB: 1933/10/12 Sex: female  REASON FOR VISIT: Follow up of left lower extremity wound  HPI: Yolanda Martin Lefeber is a 80 y.o. female who comes in for follow up after her recent arteriogram. She had presented with 2 small wounds on her left leg anteriorly. She underwent an arteriogram on 04/06/2014. This showed no significant aortoiliac occlusive disease. 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 There were extensive collaterals present. The below knee popliteal artery was patent with three-vessel runoff on the left area I felt that she likely had adequate circulation to heal the wounds on her left leg. She comes in for a follow up visit.  The wounds on her left leg have now completely healed. She has no specific complaints.  REVIEW OF SYSTEMS: Arly.Keller[X ] denotes positive finding; [  ] denotes negative finding  CARDIOVASCULAR:  [ ]  chest pain   [ ]  dyspnea on exertion    CONSTITUTIONAL:  [ ]  fever   [ ]  chills  PHYSICAL EXAM: Filed Vitals:   07/01/14 1306  BP: 194/79  Pulse: 60  Height: 5' 5.5" (1.664 m)  Weight: 105 lb 4.8 oz (47.764 kg)  SpO2: 98%   Body mass index is 17.25 kg/(m^2). GENERAL: The patient is a well-nourished female, in no acute distress. The vital signs are documented above. CARDIOVASCULAR: There is a regular rate and rhythm. She has palpable femoral pulses.  Both feet are warm and well perfused. PULMONARY: There is good air exchange bilaterally without wheezing or rales. The wounds on her left leg have completely healed.  MEDICAL ISSUES:  Atherosclerotic PVD with ulceration The wounds on her left leg and now completely healed. I have encouraged her to stay as active as possible. Fortunately she is not a smoker. I've ordered follow up ABIs in 6 months and we'll see her back at that time. She knows to call sooner if she has  problems.  Essential hypertension, benign Her initial blood pressure in the office today was 194/79. We repeated it before she left and it was down to 177/69. I have asked her to follow up with her primary medical doctor concerning her hypertension.   DICKSON,CHRISTOPHER S [facility]: [phone]

## 2014-07-01 NOTE — Assessment & Plan Note (Signed)
The wounds on her left leg and now completely healed. I have encouraged her to stay as active as possible. Fortunately she is not a smoker. I've ordered follow up ABIs in 6 months and we'll see her back at that time. She knows to call sooner if she has problems.

## 2014-07-01 NOTE — Addendum Note (Signed)
Addended by: Sharee PimpleMCCHESNEY, Adea Geisel K on: 07/01/2014 02:09 PM   Modules accepted: Orders

## 2014-07-01 NOTE — Assessment & Plan Note (Signed)
Her initial blood pressure in the office today was 194/79. We repeated it before she left and it was down to 177/69. I have asked her to follow up with her primary medical doctor concerning her hypertension.

## 2014-08-06 ENCOUNTER — Encounter (HOSPITAL_COMMUNITY): Payer: Self-pay | Admitting: Vascular Surgery

## 2014-08-12 ENCOUNTER — Telehealth: Payer: Self-pay

## 2014-08-12 NOTE — Telephone Encounter (Addendum)
Phone call from pt.  Reported she is having problems with her legs again; stated they are running water.  Was going to the Wound Center, but stated her wounds healed and she was discharged from there.  Reported swelling in bilateral LE's from feet to a few inches below the knees.  Also stated she is having trouble with her breathing.  Reported she has had shortness of breath for a long time.  Stated that sometimes she has to just sit down, because her heart feels funny.  Pt. Was advised she should call her PCP regarding her breathing and heart symptoms.  Pt. Continued to bring conversation back to her legs, and stated, "I need to have my legs looked at."  Advised pt. will call her back to schedule appt. for eval. of her legs, but to call her PCP today, and request to speak to a nurse regarding her SOB and symptoms with her heart.  Pt. stated she would call her PCP.

## 2014-08-13 ENCOUNTER — Other Ambulatory Visit: Payer: Self-pay | Admitting: *Deleted

## 2014-08-13 ENCOUNTER — Encounter: Payer: Self-pay | Admitting: Family

## 2014-08-13 DIAGNOSIS — I739 Peripheral vascular disease, unspecified: Secondary | ICD-10-CM

## 2014-08-14 ENCOUNTER — Ambulatory Visit: Payer: Medicare Other | Admitting: Family

## 2014-08-14 ENCOUNTER — Encounter (HOSPITAL_COMMUNITY): Payer: Medicare Other

## 2014-08-14 ENCOUNTER — Encounter: Payer: Self-pay | Admitting: Family

## 2014-08-14 ENCOUNTER — Ambulatory Visit (INDEPENDENT_AMBULATORY_CARE_PROVIDER_SITE_OTHER): Payer: Medicare Other | Admitting: Family

## 2014-08-14 ENCOUNTER — Ambulatory Visit (HOSPITAL_COMMUNITY)
Admission: RE | Admit: 2014-08-14 | Discharge: 2014-08-14 | Disposition: A | Discharge status: Home / Self Care | Payer: Medicare Other | Source: Ambulatory Visit | Attending: Family | Admitting: Family

## 2014-08-14 VITALS — BP 150/78 | HR 60 | Resp 14 | Ht 65.5 in | Wt 97.0 lb

## 2014-08-14 DIAGNOSIS — Z681 Body mass index (BMI) 19 or less, adult: Secondary | ICD-10-CM | POA: Diagnosis not present

## 2014-08-14 DIAGNOSIS — L98499 Non-pressure chronic ulcer of skin of other sites with unspecified severity: Secondary | ICD-10-CM

## 2014-08-14 DIAGNOSIS — R609 Edema, unspecified: Secondary | ICD-10-CM

## 2014-08-14 DIAGNOSIS — I509 Heart failure, unspecified: Secondary | ICD-10-CM

## 2014-08-14 DIAGNOSIS — I10 Essential (primary) hypertension: Secondary | ICD-10-CM | POA: Diagnosis not present

## 2014-08-14 DIAGNOSIS — Z1389 Encounter for screening for other disorder: Secondary | ICD-10-CM | POA: Diagnosis not present

## 2014-08-14 DIAGNOSIS — I739 Peripheral vascular disease, unspecified: Secondary | ICD-10-CM

## 2014-08-14 DIAGNOSIS — J449 Chronic obstructive pulmonary disease, unspecified: Secondary | ICD-10-CM | POA: Diagnosis not present

## 2014-08-14 DIAGNOSIS — I70209 Unspecified atherosclerosis of native arteries of extremities, unspecified extremity: Secondary | ICD-10-CM

## 2014-08-14 DIAGNOSIS — R600 Localized edema: Secondary | ICD-10-CM | POA: Diagnosis not present

## 2014-08-14 DIAGNOSIS — M810 Age-related osteoporosis without current pathological fracture: Secondary | ICD-10-CM | POA: Diagnosis not present

## 2014-08-14 DIAGNOSIS — R351 Nocturia: Secondary | ICD-10-CM | POA: Diagnosis not present

## 2014-08-14 NOTE — Progress Notes (Signed)
Postoperative Visit   History of Present Illness  Yolanda Martin is a 80 y.o. female who patient of Dr. Edilia Boickson who is s/p arteriogram on 04/06/2014. This showed no significant aortoiliac occlusive disease. 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 There were extensive collaterals present. The below knee popliteal artery was patent with three-vessel runoff on the left area and Dr. Edilia Boickson felt that she likely had adequate circulation to heal the wounds on her left leg.    The pt returns today with c/o wounds in lower legs resuming since she stopped receiving wound care [place].   She states that she elevates her legs when she is not busy working in her house, but states she is busy working in her house much of the time. She denies fever or chills. Pt states she was diagnosed with CHF and this is why her legs have fluid collecting.  ROS: See HPI for pertinent positives and negatives   Past Surgical History  Procedure Laterality Date  . Abdominal hysterectomy    . Abdominal aortagram N/A 04/06/2014    Procedure: ABDOMINAL Yolanda FlurryAORTAGRAM;  Surgeon: Chuck Hinthristopher S Dickson, MD;  [facility];  Service: Cardiovascular;  Laterality: N/A;   Past Medical History  Diagnosis Date  . Arthritis    History   Social History  . Marital Status: Married    Spouse Name: N/A    Number of Children: N/A  . Years of Education: N/A   Occupational History  . Not on file.   Social History Main Topics  . Smoking status: Current Every Day Smoker -- 1.00 packs/day    Types: Cigarettes  . Smokeless tobacco: Not on file  . Alcohol Use: No  . Drug Use: No  . Sexual Activity: Not on file   Other Topics Concern  . Not on file   Social History Narrative   Family History  Problem Relation Age of Onset  . Cancer Mother   . Heart disease Mother   . Cancer Father   . Cancer  Daughter    Current Outpatient Prescriptions on File Prior to Visit  Medication Sig Dispense Refill  . aspirin 81 MG tablet Take 81 mg by mouth daily.    Marland Kitchen. atorvastatin (LIPITOR) 20 MG tablet Take 1 tablet by mouth daily.  2  . furosemide (LASIX) 20 MG tablet Take 1 tablet by mouth daily.  0  . oxyCODONE-acetaminophen (ROXICET) 5-325 MG per tablet Take 1-2 tablets by mouth every 4 (four) hours as needed for severe pain. 30 tablet 0   No current facility-administered medications on file prior to visit.   No Known Allergies   Physical Examination  Filed Vitals:   08/14/14 1445  BP: 150/78  Pulse: 60  Resp: 14  Height: 5' 5.5" (1.664 m)  Weight: 97 lb (43.999 kg)   Body mass index is 15.89 kg/(m^2).  PHYSICAL EXAMINATION: General: The patient appears their stated age.   HEENT:  No gross abnormalities Pulmonary: Respirations are non-labored Abdomen: Soft and non-tender. Musculoskeletal: There are no major deformities.   Neurologic: No focal weakness or paresthesias are detected, Skin: Lower left epidermal layer deep ulcers, no erythema, no purulence. 1+ pitting edema right lower leg, 2+ left lower leg Psychiatric: The patient has normal affect. Cardiovascular: There is a regular rate and rhythm.  Vascular: Vessel Right Left  Radial Palpable Palpable  Aorta Not palpable N/A  Femoral Palpable Palpable  Popliteal Not palpable Not palpable  PT not Palpable not Palpable  DP not Palpable  Palpable    Medical Decision Making  Yolanda QuintBetty R Marchi is a 80 y.o. female who is s/p arteriogram on 04/06/2014. This showed no significant aortoiliac occlusive disease. 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 There were extensive collaterals present. The below knee popliteal artery was patent with three-vessel runoff on the left area and Dr. Edilia Boickson felt that she likely had adequate circulation to  heal the wounds on her left leg.  Dr. Edilia Boickson spoke with pt and husband and examined pt. Bilateral LE edema secondary to CHF and pt likely not elevating legs very much.  No non invasive vascular lab testing necessary today. Xeroform dressings to open wounds on lower legs, kerlex to both lower legs, ace compression wraps to both lower legs. [facility].  Elevation of legs above heart as much as possible. Graduated knee high compression hose 20-30 mm Hg once wounds are healed, wear during the day. Follow up with Dr. Edilia Boickson in 3 months with ABI's.    NICKEL, Yolanda LairSUZANNE L, RN, MSN, FNP-C [facility] [place] Office: [phone]  08/14/2014, 3:15 PM  Clinic MD: Edilia Boickson

## 2014-09-02 ENCOUNTER — Ambulatory Visit: Payer: Medicare Other | Admitting: Family

## 2014-09-02 ENCOUNTER — Encounter (HOSPITAL_COMMUNITY): Payer: Medicare Other

## 2014-09-10 ENCOUNTER — Encounter (HOSPITAL_COMMUNITY): Payer: Self-pay | Admitting: Vascular Surgery

## 2014-11-01 DIAGNOSIS — Z96642 Presence of left artificial hip joint: Secondary | ICD-10-CM | POA: Diagnosis not present

## 2014-11-01 DIAGNOSIS — S72042A Displaced fracture of base of neck of left femur, initial encounter for closed fracture: Secondary | ICD-10-CM | POA: Diagnosis not present

## 2014-11-01 DIAGNOSIS — J849 Interstitial pulmonary disease, unspecified: Secondary | ICD-10-CM | POA: Diagnosis not present

## 2014-11-01 DIAGNOSIS — T796XXA Traumatic ischemia of muscle, initial encounter: Secondary | ICD-10-CM | POA: Diagnosis not present

## 2014-11-01 DIAGNOSIS — F99 Mental disorder, not otherwise specified: Secondary | ICD-10-CM | POA: Diagnosis not present

## 2014-11-01 DIAGNOSIS — D62 Acute posthemorrhagic anemia: Secondary | ICD-10-CM | POA: Diagnosis not present

## 2014-11-01 DIAGNOSIS — J449 Chronic obstructive pulmonary disease, unspecified: Secondary | ICD-10-CM | POA: Diagnosis not present

## 2014-11-01 DIAGNOSIS — J9621 Acute and chronic respiratory failure with hypoxia: Secondary | ICD-10-CM | POA: Diagnosis not present

## 2014-11-01 DIAGNOSIS — S72002A Fracture of unspecified part of neck of left femur, initial encounter for closed fracture: Secondary | ICD-10-CM | POA: Diagnosis not present

## 2014-11-01 DIAGNOSIS — W1830XA Fall on same level, unspecified, initial encounter: Secondary | ICD-10-CM | POA: Diagnosis not present

## 2014-11-01 DIAGNOSIS — Z9181 History of falling: Secondary | ICD-10-CM | POA: Diagnosis not present

## 2014-11-01 DIAGNOSIS — W133XXA Fall through floor, initial encounter: Secondary | ICD-10-CM | POA: Diagnosis not present

## 2014-11-01 DIAGNOSIS — R05 Cough: Secondary | ICD-10-CM | POA: Diagnosis not present

## 2014-11-01 DIAGNOSIS — L03116 Cellulitis of left lower limb: Secondary | ICD-10-CM | POA: Diagnosis not present

## 2014-11-01 DIAGNOSIS — N390 Urinary tract infection, site not specified: Secondary | ICD-10-CM | POA: Diagnosis not present

## 2014-11-01 DIAGNOSIS — R278 Other lack of coordination: Secondary | ICD-10-CM | POA: Diagnosis not present

## 2014-11-01 DIAGNOSIS — Z7401 Bed confinement status: Secondary | ICD-10-CM | POA: Diagnosis not present

## 2014-11-01 DIAGNOSIS — M25552 Pain in left hip: Secondary | ICD-10-CM | POA: Diagnosis not present

## 2014-11-01 DIAGNOSIS — B9620 Unspecified Escherichia coli [E. coli] as the cause of diseases classified elsewhere: Secondary | ICD-10-CM | POA: Diagnosis present

## 2014-11-01 DIAGNOSIS — E8809 Other disorders of plasma-protein metabolism, not elsewhere classified: Secondary | ICD-10-CM | POA: Diagnosis present

## 2014-11-01 DIAGNOSIS — S72009A Fracture of unspecified part of neck of unspecified femur, initial encounter for closed fracture: Secondary | ICD-10-CM | POA: Diagnosis not present

## 2014-11-01 DIAGNOSIS — I10 Essential (primary) hypertension: Secondary | ICD-10-CM | POA: Diagnosis present

## 2014-11-01 DIAGNOSIS — M14852 Arthropathies in other specified diseases classified elsewhere, left hip: Secondary | ICD-10-CM | POA: Diagnosis not present

## 2014-11-01 DIAGNOSIS — S72012A Unspecified intracapsular fracture of left femur, initial encounter for closed fracture: Secondary | ICD-10-CM | POA: Diagnosis not present

## 2014-11-01 DIAGNOSIS — Z79899 Other long term (current) drug therapy: Secondary | ICD-10-CM | POA: Diagnosis not present

## 2014-11-01 DIAGNOSIS — J984 Other disorders of lung: Secondary | ICD-10-CM | POA: Diagnosis not present

## 2014-11-01 DIAGNOSIS — Z471 Aftercare following joint replacement surgery: Secondary | ICD-10-CM | POA: Diagnosis not present

## 2014-11-01 DIAGNOSIS — J90 Pleural effusion, not elsewhere classified: Secondary | ICD-10-CM | POA: Diagnosis not present

## 2014-11-01 DIAGNOSIS — M810 Age-related osteoporosis without current pathological fracture: Secondary | ICD-10-CM | POA: Diagnosis present

## 2014-11-01 DIAGNOSIS — I509 Heart failure, unspecified: Secondary | ICD-10-CM | POA: Diagnosis present

## 2014-11-01 DIAGNOSIS — B964 Proteus (mirabilis) (morganii) as the cause of diseases classified elsewhere: Secondary | ICD-10-CM | POA: Diagnosis present

## 2014-11-01 DIAGNOSIS — M6281 Muscle weakness (generalized): Secondary | ICD-10-CM | POA: Diagnosis not present

## 2014-11-01 DIAGNOSIS — D649 Anemia, unspecified: Secondary | ICD-10-CM | POA: Diagnosis not present

## 2014-11-01 DIAGNOSIS — Z9981 Dependence on supplemental oxygen: Secondary | ICD-10-CM | POA: Diagnosis not present

## 2014-11-05 DIAGNOSIS — N959 Unspecified menopausal and perimenopausal disorder: Secondary | ICD-10-CM | POA: Diagnosis not present

## 2014-11-05 DIAGNOSIS — M6281 Muscle weakness (generalized): Secondary | ICD-10-CM | POA: Diagnosis not present

## 2014-11-05 DIAGNOSIS — M14852 Arthropathies in other specified diseases classified elsewhere, left hip: Secondary | ICD-10-CM | POA: Diagnosis not present

## 2014-11-05 DIAGNOSIS — Z9981 Dependence on supplemental oxygen: Secondary | ICD-10-CM | POA: Diagnosis not present

## 2014-11-05 DIAGNOSIS — J84115 Respiratory bronchiolitis interstitial lung disease: Secondary | ICD-10-CM | POA: Diagnosis not present

## 2014-11-05 DIAGNOSIS — R05 Cough: Secondary | ICD-10-CM | POA: Diagnosis not present

## 2014-11-05 DIAGNOSIS — Z96642 Presence of left artificial hip joint: Secondary | ICD-10-CM | POA: Diagnosis not present

## 2014-11-05 DIAGNOSIS — Z7401 Bed confinement status: Secondary | ICD-10-CM | POA: Diagnosis not present

## 2014-11-05 DIAGNOSIS — F99 Mental disorder, not otherwise specified: Secondary | ICD-10-CM | POA: Diagnosis not present

## 2014-11-05 DIAGNOSIS — Z471 Aftercare following joint replacement surgery: Secondary | ICD-10-CM | POA: Diagnosis not present

## 2014-11-05 DIAGNOSIS — S72009A Fracture of unspecified part of neck of unspecified femur, initial encounter for closed fracture: Secondary | ICD-10-CM | POA: Diagnosis not present

## 2014-11-05 DIAGNOSIS — S72002A Fracture of unspecified part of neck of left femur, initial encounter for closed fracture: Secondary | ICD-10-CM | POA: Diagnosis not present

## 2014-11-05 DIAGNOSIS — D649 Anemia, unspecified: Secondary | ICD-10-CM | POA: Diagnosis not present

## 2014-11-05 DIAGNOSIS — J449 Chronic obstructive pulmonary disease, unspecified: Secondary | ICD-10-CM | POA: Diagnosis not present

## 2014-11-05 DIAGNOSIS — R278 Other lack of coordination: Secondary | ICD-10-CM | POA: Diagnosis not present

## 2014-11-05 DIAGNOSIS — F329 Major depressive disorder, single episode, unspecified: Secondary | ICD-10-CM | POA: Diagnosis not present

## 2014-11-05 DIAGNOSIS — J984 Other disorders of lung: Secondary | ICD-10-CM | POA: Diagnosis not present

## 2014-11-05 DIAGNOSIS — L03116 Cellulitis of left lower limb: Secondary | ICD-10-CM | POA: Diagnosis not present

## 2014-11-13 DIAGNOSIS — S72002A Fracture of unspecified part of neck of left femur, initial encounter for closed fracture: Secondary | ICD-10-CM | POA: Diagnosis not present

## 2014-11-17 DIAGNOSIS — Z96642 Presence of left artificial hip joint: Secondary | ICD-10-CM | POA: Diagnosis not present

## 2014-11-18 ENCOUNTER — Ambulatory Visit: Payer: Medicare Other | Admitting: Vascular Surgery

## 2014-11-18 ENCOUNTER — Encounter (HOSPITAL_COMMUNITY): Payer: Medicare Other

## 2014-12-15 DIAGNOSIS — Z96642 Presence of left artificial hip joint: Secondary | ICD-10-CM | POA: Diagnosis not present

## 2014-12-15 DIAGNOSIS — S72002A Fracture of unspecified part of neck of left femur, initial encounter for closed fracture: Secondary | ICD-10-CM | POA: Diagnosis not present

## 2014-12-30 ENCOUNTER — Ambulatory Visit: Payer: Medicare Other | Admitting: Vascular Surgery

## 2014-12-30 ENCOUNTER — Encounter (HOSPITAL_COMMUNITY): Payer: Medicare Other

## 2015-01-17 DIAGNOSIS — J449 Chronic obstructive pulmonary disease, unspecified: Secondary | ICD-10-CM | POA: Diagnosis not present

## 2015-01-17 DIAGNOSIS — F329 Major depressive disorder, single episode, unspecified: Secondary | ICD-10-CM | POA: Diagnosis not present

## 2015-01-17 DIAGNOSIS — N959 Unspecified menopausal and perimenopausal disorder: Secondary | ICD-10-CM | POA: Diagnosis not present

## 2015-01-17 DIAGNOSIS — J84115 Respiratory bronchiolitis interstitial lung disease: Secondary | ICD-10-CM | POA: Diagnosis not present

## 2015-01-22 DIAGNOSIS — J449 Chronic obstructive pulmonary disease, unspecified: Secondary | ICD-10-CM | POA: Diagnosis not present

## 2015-01-22 DIAGNOSIS — D649 Anemia, unspecified: Secondary | ICD-10-CM | POA: Diagnosis not present

## 2015-01-22 DIAGNOSIS — S81802D Unspecified open wound, left lower leg, subsequent encounter: Secondary | ICD-10-CM | POA: Diagnosis not present

## 2015-01-22 DIAGNOSIS — R54 Age-related physical debility: Secondary | ICD-10-CM | POA: Diagnosis not present

## 2015-01-22 DIAGNOSIS — J9691 Respiratory failure, unspecified with hypoxia: Secondary | ICD-10-CM | POA: Diagnosis not present

## 2015-01-22 DIAGNOSIS — Z96649 Presence of unspecified artificial hip joint: Secondary | ICD-10-CM | POA: Diagnosis not present

## 2015-01-22 DIAGNOSIS — F99 Mental disorder, not otherwise specified: Secondary | ICD-10-CM | POA: Diagnosis not present

## 2015-01-22 DIAGNOSIS — S7292XD Unspecified fracture of left femur, subsequent encounter for closed fracture with routine healing: Secondary | ICD-10-CM | POA: Diagnosis not present

## 2015-01-26 DIAGNOSIS — R54 Age-related physical debility: Secondary | ICD-10-CM | POA: Diagnosis not present

## 2015-01-26 DIAGNOSIS — S81802D Unspecified open wound, left lower leg, subsequent encounter: Secondary | ICD-10-CM | POA: Diagnosis not present

## 2015-01-26 DIAGNOSIS — J9691 Respiratory failure, unspecified with hypoxia: Secondary | ICD-10-CM | POA: Diagnosis not present

## 2015-01-26 DIAGNOSIS — J449 Chronic obstructive pulmonary disease, unspecified: Secondary | ICD-10-CM | POA: Diagnosis not present

## 2015-01-26 DIAGNOSIS — S7292XD Unspecified fracture of left femur, subsequent encounter for closed fracture with routine healing: Secondary | ICD-10-CM | POA: Diagnosis not present

## 2015-01-26 DIAGNOSIS — F99 Mental disorder, not otherwise specified: Secondary | ICD-10-CM | POA: Diagnosis not present

## 2015-01-27 DIAGNOSIS — S72002A Fracture of unspecified part of neck of left femur, initial encounter for closed fracture: Secondary | ICD-10-CM | POA: Diagnosis not present

## 2015-01-27 DIAGNOSIS — Z96642 Presence of left artificial hip joint: Secondary | ICD-10-CM | POA: Diagnosis not present

## 2015-01-28 DIAGNOSIS — M25552 Pain in left hip: Secondary | ICD-10-CM | POA: Diagnosis not present

## 2015-01-28 DIAGNOSIS — J440 Chronic obstructive pulmonary disease with acute lower respiratory infection: Secondary | ICD-10-CM | POA: Diagnosis not present

## 2015-01-28 DIAGNOSIS — J849 Interstitial pulmonary disease, unspecified: Secondary | ICD-10-CM | POA: Diagnosis not present

## 2015-01-28 DIAGNOSIS — I5032 Chronic diastolic (congestive) heart failure: Secondary | ICD-10-CM | POA: Diagnosis not present

## 2015-01-28 DIAGNOSIS — I4891 Unspecified atrial fibrillation: Secondary | ICD-10-CM | POA: Diagnosis not present

## 2015-01-29 DIAGNOSIS — F99 Mental disorder, not otherwise specified: Secondary | ICD-10-CM | POA: Diagnosis not present

## 2015-01-29 DIAGNOSIS — S81802D Unspecified open wound, left lower leg, subsequent encounter: Secondary | ICD-10-CM | POA: Diagnosis not present

## 2015-01-29 DIAGNOSIS — J9691 Respiratory failure, unspecified with hypoxia: Secondary | ICD-10-CM | POA: Diagnosis not present

## 2015-01-29 DIAGNOSIS — J449 Chronic obstructive pulmonary disease, unspecified: Secondary | ICD-10-CM | POA: Diagnosis not present

## 2015-01-29 DIAGNOSIS — R54 Age-related physical debility: Secondary | ICD-10-CM | POA: Diagnosis not present

## 2015-01-29 DIAGNOSIS — S7292XD Unspecified fracture of left femur, subsequent encounter for closed fracture with routine healing: Secondary | ICD-10-CM | POA: Diagnosis not present

## 2015-01-30 DIAGNOSIS — F99 Mental disorder, not otherwise specified: Secondary | ICD-10-CM | POA: Diagnosis not present

## 2015-01-30 DIAGNOSIS — S81802D Unspecified open wound, left lower leg, subsequent encounter: Secondary | ICD-10-CM | POA: Diagnosis not present

## 2015-01-30 DIAGNOSIS — S7292XD Unspecified fracture of left femur, subsequent encounter for closed fracture with routine healing: Secondary | ICD-10-CM | POA: Diagnosis not present

## 2015-01-30 DIAGNOSIS — J9691 Respiratory failure, unspecified with hypoxia: Secondary | ICD-10-CM | POA: Diagnosis not present

## 2015-01-30 DIAGNOSIS — J449 Chronic obstructive pulmonary disease, unspecified: Secondary | ICD-10-CM | POA: Diagnosis not present

## 2015-01-30 DIAGNOSIS — R54 Age-related physical debility: Secondary | ICD-10-CM | POA: Diagnosis not present

## 2015-02-02 DIAGNOSIS — S81802D Unspecified open wound, left lower leg, subsequent encounter: Secondary | ICD-10-CM | POA: Diagnosis not present

## 2015-02-02 DIAGNOSIS — F99 Mental disorder, not otherwise specified: Secondary | ICD-10-CM | POA: Diagnosis not present

## 2015-02-02 DIAGNOSIS — S7292XD Unspecified fracture of left femur, subsequent encounter for closed fracture with routine healing: Secondary | ICD-10-CM | POA: Diagnosis not present

## 2015-02-02 DIAGNOSIS — J449 Chronic obstructive pulmonary disease, unspecified: Secondary | ICD-10-CM | POA: Diagnosis not present

## 2015-02-02 DIAGNOSIS — J9691 Respiratory failure, unspecified with hypoxia: Secondary | ICD-10-CM | POA: Diagnosis not present

## 2015-02-02 DIAGNOSIS — R54 Age-related physical debility: Secondary | ICD-10-CM | POA: Diagnosis not present

## 2015-02-03 DIAGNOSIS — F99 Mental disorder, not otherwise specified: Secondary | ICD-10-CM | POA: Diagnosis not present

## 2015-02-03 DIAGNOSIS — R54 Age-related physical debility: Secondary | ICD-10-CM | POA: Diagnosis not present

## 2015-02-03 DIAGNOSIS — J9691 Respiratory failure, unspecified with hypoxia: Secondary | ICD-10-CM | POA: Diagnosis not present

## 2015-02-03 DIAGNOSIS — S81802D Unspecified open wound, left lower leg, subsequent encounter: Secondary | ICD-10-CM | POA: Diagnosis not present

## 2015-02-03 DIAGNOSIS — J449 Chronic obstructive pulmonary disease, unspecified: Secondary | ICD-10-CM | POA: Diagnosis not present

## 2015-02-03 DIAGNOSIS — S7292XD Unspecified fracture of left femur, subsequent encounter for closed fracture with routine healing: Secondary | ICD-10-CM | POA: Diagnosis not present

## 2015-02-04 DIAGNOSIS — D649 Anemia, unspecified: Secondary | ICD-10-CM | POA: Diagnosis not present

## 2015-02-04 DIAGNOSIS — R54 Age-related physical debility: Secondary | ICD-10-CM | POA: Diagnosis not present

## 2015-02-04 DIAGNOSIS — S7292XD Unspecified fracture of left femur, subsequent encounter for closed fracture with routine healing: Secondary | ICD-10-CM | POA: Diagnosis not present

## 2015-02-04 DIAGNOSIS — Z96649 Presence of unspecified artificial hip joint: Secondary | ICD-10-CM | POA: Diagnosis not present

## 2015-02-04 DIAGNOSIS — S81802D Unspecified open wound, left lower leg, subsequent encounter: Secondary | ICD-10-CM | POA: Diagnosis not present

## 2015-02-04 DIAGNOSIS — F99 Mental disorder, not otherwise specified: Secondary | ICD-10-CM | POA: Diagnosis not present

## 2015-02-04 DIAGNOSIS — J449 Chronic obstructive pulmonary disease, unspecified: Secondary | ICD-10-CM | POA: Diagnosis not present

## 2015-02-04 DIAGNOSIS — J9691 Respiratory failure, unspecified with hypoxia: Secondary | ICD-10-CM | POA: Diagnosis not present

## 2015-02-05 DIAGNOSIS — L0390 Cellulitis, unspecified: Secondary | ICD-10-CM | POA: Diagnosis not present

## 2015-02-06 DIAGNOSIS — S81802D Unspecified open wound, left lower leg, subsequent encounter: Secondary | ICD-10-CM | POA: Diagnosis not present

## 2015-02-06 DIAGNOSIS — J9691 Respiratory failure, unspecified with hypoxia: Secondary | ICD-10-CM | POA: Diagnosis not present

## 2015-02-06 DIAGNOSIS — S7292XD Unspecified fracture of left femur, subsequent encounter for closed fracture with routine healing: Secondary | ICD-10-CM | POA: Diagnosis not present

## 2015-02-06 DIAGNOSIS — R54 Age-related physical debility: Secondary | ICD-10-CM | POA: Diagnosis not present

## 2015-02-06 DIAGNOSIS — F99 Mental disorder, not otherwise specified: Secondary | ICD-10-CM | POA: Diagnosis not present

## 2015-02-06 DIAGNOSIS — J449 Chronic obstructive pulmonary disease, unspecified: Secondary | ICD-10-CM | POA: Diagnosis not present

## 2015-02-10 DIAGNOSIS — S7292XD Unspecified fracture of left femur, subsequent encounter for closed fracture with routine healing: Secondary | ICD-10-CM | POA: Diagnosis not present

## 2015-02-10 DIAGNOSIS — J449 Chronic obstructive pulmonary disease, unspecified: Secondary | ICD-10-CM | POA: Diagnosis not present

## 2015-02-10 DIAGNOSIS — S81802D Unspecified open wound, left lower leg, subsequent encounter: Secondary | ICD-10-CM | POA: Diagnosis not present

## 2015-02-10 DIAGNOSIS — J9691 Respiratory failure, unspecified with hypoxia: Secondary | ICD-10-CM | POA: Diagnosis not present

## 2015-02-10 DIAGNOSIS — F99 Mental disorder, not otherwise specified: Secondary | ICD-10-CM | POA: Diagnosis not present

## 2015-02-10 DIAGNOSIS — R54 Age-related physical debility: Secondary | ICD-10-CM | POA: Diagnosis not present

## 2015-02-11 DIAGNOSIS — S81802D Unspecified open wound, left lower leg, subsequent encounter: Secondary | ICD-10-CM | POA: Diagnosis not present

## 2015-02-11 DIAGNOSIS — J449 Chronic obstructive pulmonary disease, unspecified: Secondary | ICD-10-CM | POA: Diagnosis not present

## 2015-02-11 DIAGNOSIS — J9691 Respiratory failure, unspecified with hypoxia: Secondary | ICD-10-CM | POA: Diagnosis not present

## 2015-02-11 DIAGNOSIS — F99 Mental disorder, not otherwise specified: Secondary | ICD-10-CM | POA: Diagnosis not present

## 2015-02-11 DIAGNOSIS — S7292XD Unspecified fracture of left femur, subsequent encounter for closed fracture with routine healing: Secondary | ICD-10-CM | POA: Diagnosis not present

## 2015-02-11 DIAGNOSIS — R54 Age-related physical debility: Secondary | ICD-10-CM | POA: Diagnosis not present

## 2015-02-12 DIAGNOSIS — J9691 Respiratory failure, unspecified with hypoxia: Secondary | ICD-10-CM | POA: Diagnosis not present

## 2015-02-12 DIAGNOSIS — R54 Age-related physical debility: Secondary | ICD-10-CM | POA: Diagnosis not present

## 2015-02-12 DIAGNOSIS — F99 Mental disorder, not otherwise specified: Secondary | ICD-10-CM | POA: Diagnosis not present

## 2015-02-12 DIAGNOSIS — J449 Chronic obstructive pulmonary disease, unspecified: Secondary | ICD-10-CM | POA: Diagnosis not present

## 2015-02-12 DIAGNOSIS — S7292XD Unspecified fracture of left femur, subsequent encounter for closed fracture with routine healing: Secondary | ICD-10-CM | POA: Diagnosis not present

## 2015-02-12 DIAGNOSIS — S81802D Unspecified open wound, left lower leg, subsequent encounter: Secondary | ICD-10-CM | POA: Diagnosis not present

## 2015-02-15 DIAGNOSIS — S81802D Unspecified open wound, left lower leg, subsequent encounter: Secondary | ICD-10-CM | POA: Diagnosis not present

## 2015-02-15 DIAGNOSIS — J449 Chronic obstructive pulmonary disease, unspecified: Secondary | ICD-10-CM | POA: Diagnosis not present

## 2015-02-15 DIAGNOSIS — R54 Age-related physical debility: Secondary | ICD-10-CM | POA: Diagnosis not present

## 2015-02-15 DIAGNOSIS — J9691 Respiratory failure, unspecified with hypoxia: Secondary | ICD-10-CM | POA: Diagnosis not present

## 2015-02-15 DIAGNOSIS — F99 Mental disorder, not otherwise specified: Secondary | ICD-10-CM | POA: Diagnosis not present

## 2015-02-15 DIAGNOSIS — S7292XD Unspecified fracture of left femur, subsequent encounter for closed fracture with routine healing: Secondary | ICD-10-CM | POA: Diagnosis not present

## 2015-02-17 DIAGNOSIS — F99 Mental disorder, not otherwise specified: Secondary | ICD-10-CM | POA: Diagnosis not present

## 2015-02-17 DIAGNOSIS — S7292XD Unspecified fracture of left femur, subsequent encounter for closed fracture with routine healing: Secondary | ICD-10-CM | POA: Diagnosis not present

## 2015-02-17 DIAGNOSIS — J9691 Respiratory failure, unspecified with hypoxia: Secondary | ICD-10-CM | POA: Diagnosis not present

## 2015-02-17 DIAGNOSIS — J449 Chronic obstructive pulmonary disease, unspecified: Secondary | ICD-10-CM | POA: Diagnosis not present

## 2015-02-17 DIAGNOSIS — S81802D Unspecified open wound, left lower leg, subsequent encounter: Secondary | ICD-10-CM | POA: Diagnosis not present

## 2015-02-17 DIAGNOSIS — R54 Age-related physical debility: Secondary | ICD-10-CM | POA: Diagnosis not present

## 2015-02-19 DIAGNOSIS — F99 Mental disorder, not otherwise specified: Secondary | ICD-10-CM | POA: Diagnosis not present

## 2015-02-19 DIAGNOSIS — J9691 Respiratory failure, unspecified with hypoxia: Secondary | ICD-10-CM | POA: Diagnosis not present

## 2015-02-19 DIAGNOSIS — R54 Age-related physical debility: Secondary | ICD-10-CM | POA: Diagnosis not present

## 2015-02-19 DIAGNOSIS — S7292XD Unspecified fracture of left femur, subsequent encounter for closed fracture with routine healing: Secondary | ICD-10-CM | POA: Diagnosis not present

## 2015-02-19 DIAGNOSIS — S81802D Unspecified open wound, left lower leg, subsequent encounter: Secondary | ICD-10-CM | POA: Diagnosis not present

## 2015-02-19 DIAGNOSIS — J449 Chronic obstructive pulmonary disease, unspecified: Secondary | ICD-10-CM | POA: Diagnosis not present

## 2015-02-23 DIAGNOSIS — J449 Chronic obstructive pulmonary disease, unspecified: Secondary | ICD-10-CM | POA: Diagnosis not present

## 2015-02-23 DIAGNOSIS — J9691 Respiratory failure, unspecified with hypoxia: Secondary | ICD-10-CM | POA: Diagnosis not present

## 2015-02-23 DIAGNOSIS — S81802D Unspecified open wound, left lower leg, subsequent encounter: Secondary | ICD-10-CM | POA: Diagnosis not present

## 2015-02-23 DIAGNOSIS — S7292XD Unspecified fracture of left femur, subsequent encounter for closed fracture with routine healing: Secondary | ICD-10-CM | POA: Diagnosis not present

## 2015-02-23 DIAGNOSIS — R54 Age-related physical debility: Secondary | ICD-10-CM | POA: Diagnosis not present

## 2015-02-23 DIAGNOSIS — F99 Mental disorder, not otherwise specified: Secondary | ICD-10-CM | POA: Diagnosis not present

## 2015-02-25 DIAGNOSIS — S7292XD Unspecified fracture of left femur, subsequent encounter for closed fracture with routine healing: Secondary | ICD-10-CM | POA: Diagnosis not present

## 2015-02-25 DIAGNOSIS — S81802D Unspecified open wound, left lower leg, subsequent encounter: Secondary | ICD-10-CM | POA: Diagnosis not present

## 2015-02-25 DIAGNOSIS — J9691 Respiratory failure, unspecified with hypoxia: Secondary | ICD-10-CM | POA: Diagnosis not present

## 2015-02-25 DIAGNOSIS — F99 Mental disorder, not otherwise specified: Secondary | ICD-10-CM | POA: Diagnosis not present

## 2015-02-25 DIAGNOSIS — R54 Age-related physical debility: Secondary | ICD-10-CM | POA: Diagnosis not present

## 2015-02-25 DIAGNOSIS — J449 Chronic obstructive pulmonary disease, unspecified: Secondary | ICD-10-CM | POA: Diagnosis not present

## 2015-03-04 DIAGNOSIS — S7292XD Unspecified fracture of left femur, subsequent encounter for closed fracture with routine healing: Secondary | ICD-10-CM | POA: Diagnosis not present

## 2015-03-04 DIAGNOSIS — R54 Age-related physical debility: Secondary | ICD-10-CM | POA: Diagnosis not present

## 2015-03-04 DIAGNOSIS — F99 Mental disorder, not otherwise specified: Secondary | ICD-10-CM | POA: Diagnosis not present

## 2015-03-04 DIAGNOSIS — J449 Chronic obstructive pulmonary disease, unspecified: Secondary | ICD-10-CM | POA: Diagnosis not present

## 2015-03-04 DIAGNOSIS — S81802D Unspecified open wound, left lower leg, subsequent encounter: Secondary | ICD-10-CM | POA: Diagnosis not present

## 2015-03-04 DIAGNOSIS — J9691 Respiratory failure, unspecified with hypoxia: Secondary | ICD-10-CM | POA: Diagnosis not present

## 2015-03-12 DIAGNOSIS — I4891 Unspecified atrial fibrillation: Secondary | ICD-10-CM | POA: Diagnosis not present

## 2015-03-16 DIAGNOSIS — J9691 Respiratory failure, unspecified with hypoxia: Secondary | ICD-10-CM | POA: Diagnosis not present

## 2015-03-16 DIAGNOSIS — S7292XD Unspecified fracture of left femur, subsequent encounter for closed fracture with routine healing: Secondary | ICD-10-CM | POA: Diagnosis not present

## 2015-03-16 DIAGNOSIS — J449 Chronic obstructive pulmonary disease, unspecified: Secondary | ICD-10-CM | POA: Diagnosis not present

## 2015-03-16 DIAGNOSIS — F99 Mental disorder, not otherwise specified: Secondary | ICD-10-CM | POA: Diagnosis not present

## 2015-03-16 DIAGNOSIS — R54 Age-related physical debility: Secondary | ICD-10-CM | POA: Diagnosis not present

## 2015-03-16 DIAGNOSIS — S81802D Unspecified open wound, left lower leg, subsequent encounter: Secondary | ICD-10-CM | POA: Diagnosis not present

## 2015-03-18 DIAGNOSIS — I482 Chronic atrial fibrillation: Secondary | ICD-10-CM | POA: Diagnosis not present

## 2015-03-18 DIAGNOSIS — N3000 Acute cystitis without hematuria: Secondary | ICD-10-CM | POA: Diagnosis not present

## 2015-03-18 DIAGNOSIS — I10 Essential (primary) hypertension: Secondary | ICD-10-CM | POA: Diagnosis not present

## 2015-03-22 DIAGNOSIS — J9691 Respiratory failure, unspecified with hypoxia: Secondary | ICD-10-CM | POA: Diagnosis not present

## 2015-03-22 DIAGNOSIS — F99 Mental disorder, not otherwise specified: Secondary | ICD-10-CM | POA: Diagnosis not present

## 2015-03-22 DIAGNOSIS — J449 Chronic obstructive pulmonary disease, unspecified: Secondary | ICD-10-CM | POA: Diagnosis not present

## 2015-03-22 DIAGNOSIS — R54 Age-related physical debility: Secondary | ICD-10-CM | POA: Diagnosis not present

## 2015-03-22 DIAGNOSIS — S81802D Unspecified open wound, left lower leg, subsequent encounter: Secondary | ICD-10-CM | POA: Diagnosis not present

## 2015-03-22 DIAGNOSIS — S7292XD Unspecified fracture of left femur, subsequent encounter for closed fracture with routine healing: Secondary | ICD-10-CM | POA: Diagnosis not present

## 2015-03-23 DIAGNOSIS — M6281 Muscle weakness (generalized): Secondary | ICD-10-CM | POA: Diagnosis not present

## 2015-03-23 DIAGNOSIS — I10 Essential (primary) hypertension: Secondary | ICD-10-CM | POA: Diagnosis not present

## 2015-03-23 DIAGNOSIS — Z96649 Presence of unspecified artificial hip joint: Secondary | ICD-10-CM | POA: Diagnosis not present

## 2015-03-23 DIAGNOSIS — Z9181 History of falling: Secondary | ICD-10-CM | POA: Diagnosis not present

## 2015-03-23 DIAGNOSIS — F99 Mental disorder, not otherwise specified: Secondary | ICD-10-CM | POA: Diagnosis not present

## 2015-03-23 DIAGNOSIS — Z5181 Encounter for therapeutic drug level monitoring: Secondary | ICD-10-CM | POA: Diagnosis not present

## 2015-03-23 DIAGNOSIS — J449 Chronic obstructive pulmonary disease, unspecified: Secondary | ICD-10-CM | POA: Diagnosis not present

## 2015-03-23 DIAGNOSIS — Z7901 Long term (current) use of anticoagulants: Secondary | ICD-10-CM | POA: Diagnosis not present

## 2015-03-23 DIAGNOSIS — N390 Urinary tract infection, site not specified: Secondary | ICD-10-CM | POA: Diagnosis not present

## 2015-03-23 DIAGNOSIS — I2510 Atherosclerotic heart disease of native coronary artery without angina pectoris: Secondary | ICD-10-CM | POA: Diagnosis not present

## 2015-03-23 DIAGNOSIS — D649 Anemia, unspecified: Secondary | ICD-10-CM | POA: Diagnosis not present

## 2015-03-25 DIAGNOSIS — M6281 Muscle weakness (generalized): Secondary | ICD-10-CM | POA: Diagnosis not present

## 2015-03-25 DIAGNOSIS — N390 Urinary tract infection, site not specified: Secondary | ICD-10-CM | POA: Diagnosis not present

## 2015-03-25 DIAGNOSIS — J449 Chronic obstructive pulmonary disease, unspecified: Secondary | ICD-10-CM | POA: Diagnosis not present

## 2015-03-25 DIAGNOSIS — D649 Anemia, unspecified: Secondary | ICD-10-CM | POA: Diagnosis not present

## 2015-03-25 DIAGNOSIS — I10 Essential (primary) hypertension: Secondary | ICD-10-CM | POA: Diagnosis not present

## 2015-03-25 DIAGNOSIS — I2510 Atherosclerotic heart disease of native coronary artery without angina pectoris: Secondary | ICD-10-CM | POA: Diagnosis not present

## 2015-03-27 DIAGNOSIS — I10 Essential (primary) hypertension: Secondary | ICD-10-CM | POA: Diagnosis not present

## 2015-03-27 DIAGNOSIS — J449 Chronic obstructive pulmonary disease, unspecified: Secondary | ICD-10-CM | POA: Diagnosis not present

## 2015-03-27 DIAGNOSIS — M6281 Muscle weakness (generalized): Secondary | ICD-10-CM | POA: Diagnosis not present

## 2015-03-27 DIAGNOSIS — D649 Anemia, unspecified: Secondary | ICD-10-CM | POA: Diagnosis not present

## 2015-03-27 DIAGNOSIS — I2510 Atherosclerotic heart disease of native coronary artery without angina pectoris: Secondary | ICD-10-CM | POA: Diagnosis not present

## 2015-03-27 DIAGNOSIS — N390 Urinary tract infection, site not specified: Secondary | ICD-10-CM | POA: Diagnosis not present

## 2015-03-29 DIAGNOSIS — I10 Essential (primary) hypertension: Secondary | ICD-10-CM | POA: Diagnosis not present

## 2015-03-29 DIAGNOSIS — J449 Chronic obstructive pulmonary disease, unspecified: Secondary | ICD-10-CM | POA: Diagnosis not present

## 2015-03-29 DIAGNOSIS — M6281 Muscle weakness (generalized): Secondary | ICD-10-CM | POA: Diagnosis not present

## 2015-03-29 DIAGNOSIS — D649 Anemia, unspecified: Secondary | ICD-10-CM | POA: Diagnosis not present

## 2015-03-29 DIAGNOSIS — I2510 Atherosclerotic heart disease of native coronary artery without angina pectoris: Secondary | ICD-10-CM | POA: Diagnosis not present

## 2015-03-29 DIAGNOSIS — N390 Urinary tract infection, site not specified: Secondary | ICD-10-CM | POA: Diagnosis not present

## 2015-03-30 DIAGNOSIS — M6281 Muscle weakness (generalized): Secondary | ICD-10-CM | POA: Diagnosis not present

## 2015-03-30 DIAGNOSIS — D649 Anemia, unspecified: Secondary | ICD-10-CM | POA: Diagnosis not present

## 2015-03-30 DIAGNOSIS — N390 Urinary tract infection, site not specified: Secondary | ICD-10-CM | POA: Diagnosis not present

## 2015-03-30 DIAGNOSIS — I2510 Atherosclerotic heart disease of native coronary artery without angina pectoris: Secondary | ICD-10-CM | POA: Diagnosis not present

## 2015-03-30 DIAGNOSIS — I10 Essential (primary) hypertension: Secondary | ICD-10-CM | POA: Diagnosis not present

## 2015-03-30 DIAGNOSIS — J449 Chronic obstructive pulmonary disease, unspecified: Secondary | ICD-10-CM | POA: Diagnosis not present

## 2015-03-31 DIAGNOSIS — M6281 Muscle weakness (generalized): Secondary | ICD-10-CM | POA: Diagnosis not present

## 2015-03-31 DIAGNOSIS — J449 Chronic obstructive pulmonary disease, unspecified: Secondary | ICD-10-CM | POA: Diagnosis not present

## 2015-03-31 DIAGNOSIS — N390 Urinary tract infection, site not specified: Secondary | ICD-10-CM | POA: Diagnosis not present

## 2015-03-31 DIAGNOSIS — I2510 Atherosclerotic heart disease of native coronary artery without angina pectoris: Secondary | ICD-10-CM | POA: Diagnosis not present

## 2015-03-31 DIAGNOSIS — D649 Anemia, unspecified: Secondary | ICD-10-CM | POA: Diagnosis not present

## 2015-03-31 DIAGNOSIS — I10 Essential (primary) hypertension: Secondary | ICD-10-CM | POA: Diagnosis not present

## 2015-04-02 DIAGNOSIS — M6281 Muscle weakness (generalized): Secondary | ICD-10-CM | POA: Diagnosis not present

## 2015-04-02 DIAGNOSIS — I10 Essential (primary) hypertension: Secondary | ICD-10-CM | POA: Diagnosis not present

## 2015-04-02 DIAGNOSIS — N390 Urinary tract infection, site not specified: Secondary | ICD-10-CM | POA: Diagnosis not present

## 2015-04-02 DIAGNOSIS — I2510 Atherosclerotic heart disease of native coronary artery without angina pectoris: Secondary | ICD-10-CM | POA: Diagnosis not present

## 2015-04-02 DIAGNOSIS — J449 Chronic obstructive pulmonary disease, unspecified: Secondary | ICD-10-CM | POA: Diagnosis not present

## 2015-04-02 DIAGNOSIS — D649 Anemia, unspecified: Secondary | ICD-10-CM | POA: Diagnosis not present

## 2015-04-06 DIAGNOSIS — M6281 Muscle weakness (generalized): Secondary | ICD-10-CM | POA: Diagnosis not present

## 2015-04-06 DIAGNOSIS — J449 Chronic obstructive pulmonary disease, unspecified: Secondary | ICD-10-CM | POA: Diagnosis not present

## 2015-04-06 DIAGNOSIS — I2510 Atherosclerotic heart disease of native coronary artery without angina pectoris: Secondary | ICD-10-CM | POA: Diagnosis not present

## 2015-04-06 DIAGNOSIS — D649 Anemia, unspecified: Secondary | ICD-10-CM | POA: Diagnosis not present

## 2015-04-06 DIAGNOSIS — I10 Essential (primary) hypertension: Secondary | ICD-10-CM | POA: Diagnosis not present

## 2015-04-06 DIAGNOSIS — N390 Urinary tract infection, site not specified: Secondary | ICD-10-CM | POA: Diagnosis not present

## 2015-04-08 DIAGNOSIS — I10 Essential (primary) hypertension: Secondary | ICD-10-CM | POA: Diagnosis not present

## 2015-04-08 DIAGNOSIS — N390 Urinary tract infection, site not specified: Secondary | ICD-10-CM | POA: Diagnosis not present

## 2015-04-08 DIAGNOSIS — M6281 Muscle weakness (generalized): Secondary | ICD-10-CM | POA: Diagnosis not present

## 2015-04-08 DIAGNOSIS — J449 Chronic obstructive pulmonary disease, unspecified: Secondary | ICD-10-CM | POA: Diagnosis not present

## 2015-04-08 DIAGNOSIS — I2510 Atherosclerotic heart disease of native coronary artery without angina pectoris: Secondary | ICD-10-CM | POA: Diagnosis not present

## 2015-04-08 DIAGNOSIS — D649 Anemia, unspecified: Secondary | ICD-10-CM | POA: Diagnosis not present

## 2015-04-09 DIAGNOSIS — I10 Essential (primary) hypertension: Secondary | ICD-10-CM | POA: Diagnosis not present

## 2015-04-09 DIAGNOSIS — M6281 Muscle weakness (generalized): Secondary | ICD-10-CM | POA: Diagnosis not present

## 2015-04-09 DIAGNOSIS — I2510 Atherosclerotic heart disease of native coronary artery without angina pectoris: Secondary | ICD-10-CM | POA: Diagnosis not present

## 2015-04-09 DIAGNOSIS — J449 Chronic obstructive pulmonary disease, unspecified: Secondary | ICD-10-CM | POA: Diagnosis not present

## 2015-04-09 DIAGNOSIS — N390 Urinary tract infection, site not specified: Secondary | ICD-10-CM | POA: Diagnosis not present

## 2015-04-09 DIAGNOSIS — D649 Anemia, unspecified: Secondary | ICD-10-CM | POA: Diagnosis not present

## 2015-04-12 DIAGNOSIS — D649 Anemia, unspecified: Secondary | ICD-10-CM | POA: Diagnosis not present

## 2015-04-12 DIAGNOSIS — M6281 Muscle weakness (generalized): Secondary | ICD-10-CM | POA: Diagnosis not present

## 2015-04-12 DIAGNOSIS — J449 Chronic obstructive pulmonary disease, unspecified: Secondary | ICD-10-CM | POA: Diagnosis not present

## 2015-04-12 DIAGNOSIS — N390 Urinary tract infection, site not specified: Secondary | ICD-10-CM | POA: Diagnosis not present

## 2015-04-12 DIAGNOSIS — I10 Essential (primary) hypertension: Secondary | ICD-10-CM | POA: Diagnosis not present

## 2015-04-12 DIAGNOSIS — I2510 Atherosclerotic heart disease of native coronary artery without angina pectoris: Secondary | ICD-10-CM | POA: Diagnosis not present

## 2015-04-13 DIAGNOSIS — N390 Urinary tract infection, site not specified: Secondary | ICD-10-CM | POA: Diagnosis not present

## 2015-04-13 DIAGNOSIS — I2510 Atherosclerotic heart disease of native coronary artery without angina pectoris: Secondary | ICD-10-CM | POA: Diagnosis not present

## 2015-04-13 DIAGNOSIS — I10 Essential (primary) hypertension: Secondary | ICD-10-CM | POA: Diagnosis not present

## 2015-04-13 DIAGNOSIS — J449 Chronic obstructive pulmonary disease, unspecified: Secondary | ICD-10-CM | POA: Diagnosis not present

## 2015-04-13 DIAGNOSIS — D649 Anemia, unspecified: Secondary | ICD-10-CM | POA: Diagnosis not present

## 2015-04-13 DIAGNOSIS — M6281 Muscle weakness (generalized): Secondary | ICD-10-CM | POA: Diagnosis not present

## 2015-04-14 DIAGNOSIS — M6281 Muscle weakness (generalized): Secondary | ICD-10-CM | POA: Diagnosis not present

## 2015-04-14 DIAGNOSIS — N390 Urinary tract infection, site not specified: Secondary | ICD-10-CM | POA: Diagnosis not present

## 2015-04-14 DIAGNOSIS — I10 Essential (primary) hypertension: Secondary | ICD-10-CM | POA: Diagnosis not present

## 2015-04-14 DIAGNOSIS — D649 Anemia, unspecified: Secondary | ICD-10-CM | POA: Diagnosis not present

## 2015-04-14 DIAGNOSIS — I2510 Atherosclerotic heart disease of native coronary artery without angina pectoris: Secondary | ICD-10-CM | POA: Diagnosis not present

## 2015-04-14 DIAGNOSIS — J449 Chronic obstructive pulmonary disease, unspecified: Secondary | ICD-10-CM | POA: Diagnosis not present

## 2015-04-19 DIAGNOSIS — M6281 Muscle weakness (generalized): Secondary | ICD-10-CM | POA: Diagnosis not present

## 2015-04-19 DIAGNOSIS — I2510 Atherosclerotic heart disease of native coronary artery without angina pectoris: Secondary | ICD-10-CM | POA: Diagnosis not present

## 2015-04-19 DIAGNOSIS — D649 Anemia, unspecified: Secondary | ICD-10-CM | POA: Diagnosis not present

## 2015-04-19 DIAGNOSIS — J449 Chronic obstructive pulmonary disease, unspecified: Secondary | ICD-10-CM | POA: Diagnosis not present

## 2015-04-19 DIAGNOSIS — N390 Urinary tract infection, site not specified: Secondary | ICD-10-CM | POA: Diagnosis not present

## 2015-04-19 DIAGNOSIS — I10 Essential (primary) hypertension: Secondary | ICD-10-CM | POA: Diagnosis not present

## 2015-04-21 DIAGNOSIS — J449 Chronic obstructive pulmonary disease, unspecified: Secondary | ICD-10-CM | POA: Diagnosis not present

## 2015-04-21 DIAGNOSIS — I2510 Atherosclerotic heart disease of native coronary artery without angina pectoris: Secondary | ICD-10-CM | POA: Diagnosis not present

## 2015-04-21 DIAGNOSIS — N390 Urinary tract infection, site not specified: Secondary | ICD-10-CM | POA: Diagnosis not present

## 2015-04-21 DIAGNOSIS — D649 Anemia, unspecified: Secondary | ICD-10-CM | POA: Diagnosis not present

## 2015-04-21 DIAGNOSIS — I10 Essential (primary) hypertension: Secondary | ICD-10-CM | POA: Diagnosis not present

## 2015-04-21 DIAGNOSIS — M6281 Muscle weakness (generalized): Secondary | ICD-10-CM | POA: Diagnosis not present

## 2015-04-26 DIAGNOSIS — N390 Urinary tract infection, site not specified: Secondary | ICD-10-CM | POA: Diagnosis not present

## 2015-04-26 DIAGNOSIS — I2510 Atherosclerotic heart disease of native coronary artery without angina pectoris: Secondary | ICD-10-CM | POA: Diagnosis not present

## 2015-04-26 DIAGNOSIS — I10 Essential (primary) hypertension: Secondary | ICD-10-CM | POA: Diagnosis not present

## 2015-04-26 DIAGNOSIS — J449 Chronic obstructive pulmonary disease, unspecified: Secondary | ICD-10-CM | POA: Diagnosis not present

## 2015-04-26 DIAGNOSIS — M6281 Muscle weakness (generalized): Secondary | ICD-10-CM | POA: Diagnosis not present

## 2015-04-26 DIAGNOSIS — D649 Anemia, unspecified: Secondary | ICD-10-CM | POA: Diagnosis not present

## 2015-04-28 ENCOUNTER — Emergency Department (HOSPITAL_COMMUNITY)
Admission: EM | Admit: 2015-04-28 | Discharge: 2015-04-28 | Disposition: A | Discharge status: Home / Self Care | Payer: Medicare Other | Attending: Emergency Medicine | Admitting: Emergency Medicine

## 2015-04-28 ENCOUNTER — Encounter (HOSPITAL_COMMUNITY): Payer: Self-pay | Admitting: Emergency Medicine

## 2015-04-28 DIAGNOSIS — I10 Essential (primary) hypertension: Secondary | ICD-10-CM | POA: Diagnosis not present

## 2015-04-28 DIAGNOSIS — Z7901 Long term (current) use of anticoagulants: Secondary | ICD-10-CM | POA: Diagnosis not present

## 2015-04-28 DIAGNOSIS — Y9389 Activity, other specified: Secondary | ICD-10-CM | POA: Diagnosis not present

## 2015-04-28 DIAGNOSIS — J449 Chronic obstructive pulmonary disease, unspecified: Secondary | ICD-10-CM | POA: Diagnosis not present

## 2015-04-28 DIAGNOSIS — S81812A Laceration without foreign body, left lower leg, initial encounter: Secondary | ICD-10-CM

## 2015-04-28 DIAGNOSIS — Z23 Encounter for immunization: Secondary | ICD-10-CM | POA: Insufficient documentation

## 2015-04-28 DIAGNOSIS — S81802A Unspecified open wound, left lower leg, initial encounter: Secondary | ICD-10-CM | POA: Diagnosis not present

## 2015-04-28 DIAGNOSIS — W050XXA Fall from non-moving wheelchair, initial encounter: Secondary | ICD-10-CM | POA: Insufficient documentation

## 2015-04-28 DIAGNOSIS — M1990 Unspecified osteoarthritis, unspecified site: Secondary | ICD-10-CM | POA: Insufficient documentation

## 2015-04-28 DIAGNOSIS — N390 Urinary tract infection, site not specified: Secondary | ICD-10-CM | POA: Diagnosis not present

## 2015-04-28 DIAGNOSIS — Y9289 Other specified places as the place of occurrence of the external cause: Secondary | ICD-10-CM | POA: Diagnosis not present

## 2015-04-28 DIAGNOSIS — I2510 Atherosclerotic heart disease of native coronary artery without angina pectoris: Secondary | ICD-10-CM | POA: Diagnosis not present

## 2015-04-28 DIAGNOSIS — Y998 Other external cause status: Secondary | ICD-10-CM | POA: Insufficient documentation

## 2015-04-28 DIAGNOSIS — M6281 Muscle weakness (generalized): Secondary | ICD-10-CM | POA: Diagnosis not present

## 2015-04-28 DIAGNOSIS — Z720 Tobacco use: Secondary | ICD-10-CM | POA: Insufficient documentation

## 2015-04-28 DIAGNOSIS — Z5181 Encounter for therapeutic drug level monitoring: Secondary | ICD-10-CM | POA: Diagnosis not present

## 2015-04-28 DIAGNOSIS — Z79899 Other long term (current) drug therapy: Secondary | ICD-10-CM | POA: Insufficient documentation

## 2015-04-28 DIAGNOSIS — D649 Anemia, unspecified: Secondary | ICD-10-CM | POA: Diagnosis not present

## 2015-04-28 DIAGNOSIS — F99 Mental disorder, not otherwise specified: Secondary | ICD-10-CM | POA: Diagnosis not present

## 2015-04-28 MED ORDER — CEPHALEXIN 250 MG PO CAPS
250.0000 mg | ORAL_CAPSULE | Freq: Once | ORAL | Status: AC
  Administered 2015-04-28: 250 mg via ORAL
  Filled 2015-04-28: qty 1

## 2015-04-28 MED ORDER — TETANUS-DIPHTH-ACELL PERTUSSIS 5-2.5-18.5 LF-MCG/0.5 IM SUSP
0.5000 mL | Freq: Once | INTRAMUSCULAR | Status: AC
  Administered 2015-04-28: 0.5 mL via INTRAMUSCULAR
  Filled 2015-04-28: qty 0.5

## 2015-04-28 NOTE — ED Provider Notes (Signed)
CSN: [id number]     Arrival date & time 04/28/15  1248 History   First MD Initiated Contact with Patient 04/28/15 1409     Chief Complaint  Patient presents with  . Extremity Laceration    left     (Consider location/radiation/quality/duration/timing/severity/associated sxs/prior Treatment) HPI 81 year old female who has been nonambulatory since a hip fracture last year. She is living with her daughter and is in a wheelchair use. Yesterday she came forward and fell partially out of the wheelchair lacerating her right forearm her left shin. Daughter attempted to clean it with hydrogen peroxide and wound cleaner. A dressing was placed. Today the home health care nurse saw it and advised him to come to hospital for further evaluation. She denies any other injury. She did not strike her head. She is on Coumadin. She does not know when her last tetanus was. Past Medical History  Diagnosis Date  . Arthritis    Past Surgical History  Procedure Laterality Date  . Abdominal hysterectomy    . Abdominal aortagram N/A 04/06/2014    Procedure: ABDOMINAL Ronny Flurry;  Surgeon: Chuck Hint, MD;  Location: [facility] CATH LAB;  Service: Cardiovascular;  Laterality: N/A;   Family History  Problem Relation Age of Onset  . Cancer Mother   . Heart disease Mother   . Cancer Father   . Cancer Daughter    Social History  Substance Use Topics  . Smoking status: Current Every Day Smoker -- 1.00 packs/day    Types: Cigarettes  . Smokeless tobacco: None  . Alcohol Use: No   OB History    No data available     Review of Systems  All other systems reviewed and are negative.     Allergies  Review of patient's allergies indicates no known allergies.  Home Medications   Prior to Admission medications   Medication Sig Start Date End Date Taking? Authorizing Provider  ALPRAZolam Prudy Feeler) 0.5 MG tablet Take 0.25 mg by mouth 2 (two) times daily. 03/18/15  Yes Historical Provider, MD  amLODipine  (NORVASC) 5 MG tablet Take 5 mg by mouth daily. 02/21/15  Yes Historical Provider, MD  benazepril (LOTENSIN) 10 MG tablet Take 10 mg by mouth daily. 02/21/15  Yes Historical Provider, MD  Calcium-Phosphorus-Vitamin D (CALCIUM GUMMIES PO) Take 1 tablet by mouth at bedtime.   Yes Historical Provider, MD  cholecalciferol (VITAMIN D) 1000 UNITS tablet Take 1,000 Units by mouth daily.   Yes Historical Provider, MD  citalopram (CELEXA) 20 MG tablet Take 20 mg by mouth daily. 02/19/15  Yes Historical Provider, MD  furosemide (LASIX) 20 MG tablet Take 1 tablet by mouth daily. 05/10/14  Yes Historical Provider, MD  HYDROcodone-acetaminophen (NORCO/VICODIN) 5-325 MG per tablet Take 1 tablet by mouth 2 (two) times daily.  03/26/15  Yes Historical Provider, MD  methocarbamol (ROBAXIN) 500 MG tablet Take 500 mg by mouth 2 (two) times daily.   Yes Historical Provider, MD  Multiple Vitamins-Minerals (MULTIVITAMIN ADULT PO) Take 1 tablet by mouth daily.   Yes Historical Provider, MD  ranitidine (ZANTAC) 300 MG tablet Take 300 mg by mouth daily. 04/16/15  Yes Historical Provider, MD  warfarin (COUMADIN) 2 MG tablet Take 2 mg by mouth daily. 04/16/15  Yes Historical Provider, MD  oxyCODONE-acetaminophen (ROXICET) 5-325 MG per tablet Take 1-2 tablets by mouth every 4 (four) hours as needed for severe pain. Patient not taking: Reported on 04/28/2015 03/11/14   Chuck Hint, MD   BP 144/50 mmHg  Pulse 55  Temp(Src) 97.6 F (36.4 C) (Oral)  Resp 16  SpO2 95% Physical Exam  Constitutional: She is oriented to person, place, and time. She appears well-developed and well-nourished.  Chronically ill-appearing female  HENT:  Head: Normocephalic and atraumatic.  Right Ear: External ear normal.  Left Ear: External ear normal.  Nose: Nose normal.  Mouth/Throat: Oropharynx is clear and moist.  Eyes: Conjunctivae are normal. Pupils are equal, round, and reactive to light.  Neck: Normal range of motion. Neck supple.   Cardiovascular: Normal rate, regular rhythm, normal heart sounds and intact distal pulses.   Pulmonary/Chest: Effort normal and breath sounds normal.  Abdominal: Soft. Bowel sounds are normal.  Musculoskeletal:  Skin tear left lower leg anterior aspect. 4 x 5 cm area.  Neurological: She is alert and oriented to person, place, and time.  Skin: Skin is warm and dry.  Psychiatric: She has a normal mood and affect.  Nursing note and vitals reviewed.   ED Course  Procedures (including critical care time) Labs Review Labs Reviewed - No data to display  Imaging Review No results found. I have personally reviewed and evaluated these images and lab results as part of my medical decision-making.   EKG Interpretation None      MDM   Final diagnoses:  Skin tear of lower leg without complication, left, initial encounter    This is a 81 year old female skin tear to her left lower leg after a wheelchair accident yesterday. Skin is thin and wound is significant for skin being rolled back over 4-5 cm area. We will place Tegaderm and have her follow-up with primary care for recheck. She is also placed on Keflex as it is erythematous around it although she has chronic skin changes. It is not clear how well the wound was cleaned yesterday. Her tetanus is updated.  Margarita Grizzle, MD 04/28/15 [phone]

## 2015-04-28 NOTE — ED Notes (Signed)
Bed: ZO10 Expected date:  Expected time:  Means of arrival:  Comments: EMS- 80yo F, shin laceration, blood thinners, non-ambulatory

## 2015-04-28 NOTE — ED Notes (Signed)
Per ems pt from home, pt had fall yesterday slid out of her wheelchair, pt co of left leg lac . Presents with dressing that was put by home nurse today. Pt on coumadin . Per ems pt appears dry . Pt is alert and oriented . Pt is non-ambulatory since last year when she had fractured left hip.

## 2015-04-28 NOTE — Discharge Instructions (Signed)
Skin Tear Care °A skin tear is when the top layer of skin peels off. To repair the skin, your doctor may use:  °· Tape. °· Skin adhesive strips. °HOME CARE °· Change bandages (dressings) once a day or as told by your doctor. °¨ Gently clean the area with salt (saline) solution or with a mild soap and water. °¨ Do not rub the injured skin dry. Let the area air dry. °¨ Put petroleum jelly or antibiotic cream on the tear. Do not allow a scab to form. °¨ If the bandage sticks, moisten it with warm soapy water and remove it. °· Protect the injured skin until it has healed. °· Only take medicine as told by your doctor. °· Take showers or baths using warm soapy water. Apply a new bandage after the shower or bath. °· Keep all doctor visits as told. °GET HELP RIGHT AWAY IF:  °· You have redness, puffiness (swelling), or more pain in the tear. °· You have a yellowish-white fluid (pus) coming from the tear. °· You have chills. °· You have a red streak that goes away from the tear. °· You have a bad smell coming from the tear or bandage. °· You have a fever or lasting symptoms for more than 2-3 days. °· You have a fever and your symptoms suddenly get worse. °MAKE SURE YOU:  °· Understand these instructions. °· Will watch this condition. °· Will get help right away if you are not doing well or get worse. °Document Released: 05/23/2008 Document Revised: 05/08/2012 Document Reviewed: 02/26/2012 °[employer]® Patient Information ©2015 [employer]. This information is not intended to replace advice given to you by your health care provider. Make sure you discuss any questions you have with your health care provider. ° °

## 2015-04-30 DIAGNOSIS — I10 Essential (primary) hypertension: Secondary | ICD-10-CM | POA: Diagnosis not present

## 2015-04-30 DIAGNOSIS — D649 Anemia, unspecified: Secondary | ICD-10-CM | POA: Diagnosis not present

## 2015-04-30 DIAGNOSIS — M6281 Muscle weakness (generalized): Secondary | ICD-10-CM | POA: Diagnosis not present

## 2015-04-30 DIAGNOSIS — N390 Urinary tract infection, site not specified: Secondary | ICD-10-CM | POA: Diagnosis not present

## 2015-04-30 DIAGNOSIS — J449 Chronic obstructive pulmonary disease, unspecified: Secondary | ICD-10-CM | POA: Diagnosis not present

## 2015-04-30 DIAGNOSIS — I2510 Atherosclerotic heart disease of native coronary artery without angina pectoris: Secondary | ICD-10-CM | POA: Diagnosis not present

## 2015-05-04 DIAGNOSIS — L8992 Pressure ulcer of unspecified site, stage 2: Secondary | ICD-10-CM | POA: Diagnosis not present

## 2015-05-05 DIAGNOSIS — I2510 Atherosclerotic heart disease of native coronary artery without angina pectoris: Secondary | ICD-10-CM | POA: Diagnosis not present

## 2015-05-05 DIAGNOSIS — D649 Anemia, unspecified: Secondary | ICD-10-CM | POA: Diagnosis not present

## 2015-05-05 DIAGNOSIS — N390 Urinary tract infection, site not specified: Secondary | ICD-10-CM | POA: Diagnosis not present

## 2015-05-05 DIAGNOSIS — M6281 Muscle weakness (generalized): Secondary | ICD-10-CM | POA: Diagnosis not present

## 2015-05-05 DIAGNOSIS — I10 Essential (primary) hypertension: Secondary | ICD-10-CM | POA: Diagnosis not present

## 2015-05-05 DIAGNOSIS — J449 Chronic obstructive pulmonary disease, unspecified: Secondary | ICD-10-CM | POA: Diagnosis not present

## 2015-05-06 DIAGNOSIS — M6281 Muscle weakness (generalized): Secondary | ICD-10-CM | POA: Diagnosis not present

## 2015-05-06 DIAGNOSIS — D649 Anemia, unspecified: Secondary | ICD-10-CM | POA: Diagnosis not present

## 2015-05-06 DIAGNOSIS — I10 Essential (primary) hypertension: Secondary | ICD-10-CM | POA: Diagnosis not present

## 2015-05-06 DIAGNOSIS — I2510 Atherosclerotic heart disease of native coronary artery without angina pectoris: Secondary | ICD-10-CM | POA: Diagnosis not present

## 2015-05-06 DIAGNOSIS — J449 Chronic obstructive pulmonary disease, unspecified: Secondary | ICD-10-CM | POA: Diagnosis not present

## 2015-05-06 DIAGNOSIS — N390 Urinary tract infection, site not specified: Secondary | ICD-10-CM | POA: Diagnosis not present

## 2015-05-07 DIAGNOSIS — J449 Chronic obstructive pulmonary disease, unspecified: Secondary | ICD-10-CM | POA: Diagnosis not present

## 2015-05-07 DIAGNOSIS — M6281 Muscle weakness (generalized): Secondary | ICD-10-CM | POA: Diagnosis not present

## 2015-05-07 DIAGNOSIS — I2510 Atherosclerotic heart disease of native coronary artery without angina pectoris: Secondary | ICD-10-CM | POA: Diagnosis not present

## 2015-05-07 DIAGNOSIS — I10 Essential (primary) hypertension: Secondary | ICD-10-CM | POA: Diagnosis not present

## 2015-05-07 DIAGNOSIS — N390 Urinary tract infection, site not specified: Secondary | ICD-10-CM | POA: Diagnosis not present

## 2015-05-07 DIAGNOSIS — D649 Anemia, unspecified: Secondary | ICD-10-CM | POA: Diagnosis not present

## 2015-05-08 DIAGNOSIS — N390 Urinary tract infection, site not specified: Secondary | ICD-10-CM | POA: Diagnosis not present

## 2015-05-08 DIAGNOSIS — J449 Chronic obstructive pulmonary disease, unspecified: Secondary | ICD-10-CM | POA: Diagnosis not present

## 2015-05-08 DIAGNOSIS — I10 Essential (primary) hypertension: Secondary | ICD-10-CM | POA: Diagnosis not present

## 2015-05-08 DIAGNOSIS — M6281 Muscle weakness (generalized): Secondary | ICD-10-CM | POA: Diagnosis not present

## 2015-05-08 DIAGNOSIS — I2510 Atherosclerotic heart disease of native coronary artery without angina pectoris: Secondary | ICD-10-CM | POA: Diagnosis not present

## 2015-05-08 DIAGNOSIS — D649 Anemia, unspecified: Secondary | ICD-10-CM | POA: Diagnosis not present

## 2015-05-10 DIAGNOSIS — L97829 Non-pressure chronic ulcer of other part of left lower leg with unspecified severity: Secondary | ICD-10-CM | POA: Diagnosis not present

## 2015-05-10 DIAGNOSIS — I83008 Varicose veins of unspecified lower extremity with ulcer other part of lower leg: Secondary | ICD-10-CM | POA: Diagnosis present

## 2015-05-10 DIAGNOSIS — F17200 Nicotine dependence, unspecified, uncomplicated: Secondary | ICD-10-CM | POA: Diagnosis present

## 2015-05-10 DIAGNOSIS — Z8249 Family history of ischemic heart disease and other diseases of the circulatory system: Secondary | ICD-10-CM | POA: Diagnosis not present

## 2015-05-10 DIAGNOSIS — L97819 Non-pressure chronic ulcer of other part of right lower leg with unspecified severity: Secondary | ICD-10-CM | POA: Diagnosis not present

## 2015-05-10 DIAGNOSIS — J449 Chronic obstructive pulmonary disease, unspecified: Secondary | ICD-10-CM | POA: Diagnosis present

## 2015-05-10 DIAGNOSIS — I872 Venous insufficiency (chronic) (peripheral): Secondary | ICD-10-CM | POA: Diagnosis present

## 2015-05-10 DIAGNOSIS — Z7901 Long term (current) use of anticoagulants: Secondary | ICD-10-CM | POA: Diagnosis not present

## 2015-05-10 DIAGNOSIS — Z801 Family history of malignant neoplasm of trachea, bronchus and lung: Secondary | ICD-10-CM | POA: Diagnosis not present

## 2015-05-10 DIAGNOSIS — Z7401 Bed confinement status: Secondary | ICD-10-CM | POA: Diagnosis not present

## 2015-05-10 DIAGNOSIS — L03115 Cellulitis of right lower limb: Secondary | ICD-10-CM | POA: Diagnosis not present

## 2015-05-10 DIAGNOSIS — J849 Interstitial pulmonary disease, unspecified: Secondary | ICD-10-CM | POA: Diagnosis not present

## 2015-05-10 DIAGNOSIS — Z79899 Other long term (current) drug therapy: Secondary | ICD-10-CM | POA: Diagnosis not present

## 2015-05-10 DIAGNOSIS — I509 Heart failure, unspecified: Secondary | ICD-10-CM | POA: Diagnosis not present

## 2015-05-10 DIAGNOSIS — R279 Unspecified lack of coordination: Secondary | ICD-10-CM | POA: Diagnosis not present

## 2015-05-10 DIAGNOSIS — L03116 Cellulitis of left lower limb: Secondary | ICD-10-CM | POA: Diagnosis not present

## 2015-05-10 DIAGNOSIS — Z96642 Presence of left artificial hip joint: Secondary | ICD-10-CM | POA: Diagnosis present

## 2015-05-10 DIAGNOSIS — Z7951 Long term (current) use of inhaled steroids: Secondary | ICD-10-CM | POA: Diagnosis not present

## 2015-05-11 DIAGNOSIS — J849 Interstitial pulmonary disease, unspecified: Secondary | ICD-10-CM | POA: Diagnosis not present

## 2015-05-11 DIAGNOSIS — L03115 Cellulitis of right lower limb: Secondary | ICD-10-CM | POA: Diagnosis not present

## 2015-05-11 DIAGNOSIS — L97819 Non-pressure chronic ulcer of other part of right lower leg with unspecified severity: Secondary | ICD-10-CM | POA: Diagnosis not present

## 2015-05-11 DIAGNOSIS — I509 Heart failure, unspecified: Secondary | ICD-10-CM | POA: Diagnosis not present

## 2015-05-11 DIAGNOSIS — L97829 Non-pressure chronic ulcer of other part of left lower leg with unspecified severity: Secondary | ICD-10-CM | POA: Diagnosis not present

## 2015-05-11 DIAGNOSIS — L03116 Cellulitis of left lower limb: Secondary | ICD-10-CM | POA: Diagnosis not present

## 2015-05-13 DIAGNOSIS — L03116 Cellulitis of left lower limb: Secondary | ICD-10-CM | POA: Diagnosis not present

## 2015-05-13 DIAGNOSIS — R279 Unspecified lack of coordination: Secondary | ICD-10-CM | POA: Diagnosis not present

## 2015-05-13 DIAGNOSIS — L97829 Non-pressure chronic ulcer of other part of left lower leg with unspecified severity: Secondary | ICD-10-CM | POA: Diagnosis not present

## 2015-05-13 DIAGNOSIS — L97819 Non-pressure chronic ulcer of other part of right lower leg with unspecified severity: Secondary | ICD-10-CM | POA: Diagnosis not present

## 2015-05-13 DIAGNOSIS — Z7401 Bed confinement status: Secondary | ICD-10-CM | POA: Diagnosis not present

## 2015-05-13 DIAGNOSIS — J849 Interstitial pulmonary disease, unspecified: Secondary | ICD-10-CM | POA: Diagnosis not present

## 2015-05-13 DIAGNOSIS — L03115 Cellulitis of right lower limb: Secondary | ICD-10-CM | POA: Diagnosis not present

## 2015-05-17 DIAGNOSIS — Z7901 Long term (current) use of anticoagulants: Secondary | ICD-10-CM | POA: Diagnosis not present

## 2015-05-17 DIAGNOSIS — L03116 Cellulitis of left lower limb: Secondary | ICD-10-CM | POA: Diagnosis not present

## 2015-05-17 DIAGNOSIS — L97821 Non-pressure chronic ulcer of other part of left lower leg limited to breakdown of skin: Secondary | ICD-10-CM | POA: Diagnosis not present

## 2015-05-17 DIAGNOSIS — L97819 Non-pressure chronic ulcer of other part of right lower leg with unspecified severity: Secondary | ICD-10-CM | POA: Diagnosis not present

## 2015-05-17 DIAGNOSIS — J449 Chronic obstructive pulmonary disease, unspecified: Secondary | ICD-10-CM | POA: Diagnosis not present

## 2015-05-17 DIAGNOSIS — J849 Interstitial pulmonary disease, unspecified: Secondary | ICD-10-CM | POA: Diagnosis not present

## 2015-05-17 DIAGNOSIS — L97829 Non-pressure chronic ulcer of other part of left lower leg with unspecified severity: Secondary | ICD-10-CM | POA: Diagnosis not present

## 2015-05-17 DIAGNOSIS — I83218 Varicose veins of right lower extremity with both ulcer of other part of lower extremity and inflammation: Secondary | ICD-10-CM | POA: Diagnosis not present

## 2015-05-17 DIAGNOSIS — I83228 Varicose veins of left lower extremity with both ulcer of other part of lower extremity and inflammation: Secondary | ICD-10-CM | POA: Diagnosis not present

## 2015-05-17 DIAGNOSIS — I10 Essential (primary) hypertension: Secondary | ICD-10-CM | POA: Diagnosis not present

## 2015-05-17 DIAGNOSIS — F17210 Nicotine dependence, cigarettes, uncomplicated: Secondary | ICD-10-CM | POA: Diagnosis not present

## 2015-05-17 DIAGNOSIS — L03115 Cellulitis of right lower limb: Secondary | ICD-10-CM | POA: Diagnosis not present

## 2015-05-18 DIAGNOSIS — I83228 Varicose veins of left lower extremity with both ulcer of other part of lower extremity and inflammation: Secondary | ICD-10-CM | POA: Diagnosis not present

## 2015-05-18 DIAGNOSIS — L03115 Cellulitis of right lower limb: Secondary | ICD-10-CM | POA: Diagnosis not present

## 2015-05-18 DIAGNOSIS — L03116 Cellulitis of left lower limb: Secondary | ICD-10-CM | POA: Diagnosis not present

## 2015-05-18 DIAGNOSIS — L97829 Non-pressure chronic ulcer of other part of left lower leg with unspecified severity: Secondary | ICD-10-CM | POA: Diagnosis not present

## 2015-05-18 DIAGNOSIS — L97819 Non-pressure chronic ulcer of other part of right lower leg with unspecified severity: Secondary | ICD-10-CM | POA: Diagnosis not present

## 2015-05-18 DIAGNOSIS — I83218 Varicose veins of right lower extremity with both ulcer of other part of lower extremity and inflammation: Secondary | ICD-10-CM | POA: Diagnosis not present

## 2015-05-19 DIAGNOSIS — I83218 Varicose veins of right lower extremity with both ulcer of other part of lower extremity and inflammation: Secondary | ICD-10-CM | POA: Diagnosis not present

## 2015-05-19 DIAGNOSIS — I83228 Varicose veins of left lower extremity with both ulcer of other part of lower extremity and inflammation: Secondary | ICD-10-CM | POA: Diagnosis not present

## 2015-05-19 DIAGNOSIS — L03115 Cellulitis of right lower limb: Secondary | ICD-10-CM | POA: Diagnosis not present

## 2015-05-19 DIAGNOSIS — L03116 Cellulitis of left lower limb: Secondary | ICD-10-CM | POA: Diagnosis not present

## 2015-05-19 DIAGNOSIS — L97829 Non-pressure chronic ulcer of other part of left lower leg with unspecified severity: Secondary | ICD-10-CM | POA: Diagnosis not present

## 2015-05-19 DIAGNOSIS — L97819 Non-pressure chronic ulcer of other part of right lower leg with unspecified severity: Secondary | ICD-10-CM | POA: Diagnosis not present

## 2015-05-20 DIAGNOSIS — L97819 Non-pressure chronic ulcer of other part of right lower leg with unspecified severity: Secondary | ICD-10-CM | POA: Diagnosis not present

## 2015-05-20 DIAGNOSIS — L97829 Non-pressure chronic ulcer of other part of left lower leg with unspecified severity: Secondary | ICD-10-CM | POA: Diagnosis not present

## 2015-05-20 DIAGNOSIS — Z23 Encounter for immunization: Secondary | ICD-10-CM | POA: Diagnosis not present

## 2015-05-20 DIAGNOSIS — I83228 Varicose veins of left lower extremity with both ulcer of other part of lower extremity and inflammation: Secondary | ICD-10-CM | POA: Diagnosis not present

## 2015-05-20 DIAGNOSIS — L03116 Cellulitis of left lower limb: Secondary | ICD-10-CM | POA: Diagnosis not present

## 2015-05-20 DIAGNOSIS — I83218 Varicose veins of right lower extremity with both ulcer of other part of lower extremity and inflammation: Secondary | ICD-10-CM | POA: Diagnosis not present

## 2015-05-20 DIAGNOSIS — L03115 Cellulitis of right lower limb: Secondary | ICD-10-CM | POA: Diagnosis not present

## 2015-05-21 DIAGNOSIS — I83218 Varicose veins of right lower extremity with both ulcer of other part of lower extremity and inflammation: Secondary | ICD-10-CM | POA: Diagnosis not present

## 2015-05-21 DIAGNOSIS — I83228 Varicose veins of left lower extremity with both ulcer of other part of lower extremity and inflammation: Secondary | ICD-10-CM | POA: Diagnosis not present

## 2015-05-21 DIAGNOSIS — L97829 Non-pressure chronic ulcer of other part of left lower leg with unspecified severity: Secondary | ICD-10-CM | POA: Diagnosis not present

## 2015-05-21 DIAGNOSIS — L97819 Non-pressure chronic ulcer of other part of right lower leg with unspecified severity: Secondary | ICD-10-CM | POA: Diagnosis not present

## 2015-05-21 DIAGNOSIS — L03116 Cellulitis of left lower limb: Secondary | ICD-10-CM | POA: Diagnosis not present

## 2015-05-21 DIAGNOSIS — L03115 Cellulitis of right lower limb: Secondary | ICD-10-CM | POA: Diagnosis not present

## 2015-05-24 DIAGNOSIS — L97829 Non-pressure chronic ulcer of other part of left lower leg with unspecified severity: Secondary | ICD-10-CM | POA: Diagnosis not present

## 2015-05-24 DIAGNOSIS — I83218 Varicose veins of right lower extremity with both ulcer of other part of lower extremity and inflammation: Secondary | ICD-10-CM | POA: Diagnosis not present

## 2015-05-24 DIAGNOSIS — L03116 Cellulitis of left lower limb: Secondary | ICD-10-CM | POA: Diagnosis not present

## 2015-05-24 DIAGNOSIS — L97819 Non-pressure chronic ulcer of other part of right lower leg with unspecified severity: Secondary | ICD-10-CM | POA: Diagnosis not present

## 2015-05-24 DIAGNOSIS — I83228 Varicose veins of left lower extremity with both ulcer of other part of lower extremity and inflammation: Secondary | ICD-10-CM | POA: Diagnosis not present

## 2015-05-24 DIAGNOSIS — L03115 Cellulitis of right lower limb: Secondary | ICD-10-CM | POA: Diagnosis not present

## 2015-05-26 DIAGNOSIS — L03116 Cellulitis of left lower limb: Secondary | ICD-10-CM | POA: Diagnosis not present

## 2015-05-26 DIAGNOSIS — L97819 Non-pressure chronic ulcer of other part of right lower leg with unspecified severity: Secondary | ICD-10-CM | POA: Diagnosis not present

## 2015-05-26 DIAGNOSIS — L03115 Cellulitis of right lower limb: Secondary | ICD-10-CM | POA: Diagnosis not present

## 2015-05-26 DIAGNOSIS — I83228 Varicose veins of left lower extremity with both ulcer of other part of lower extremity and inflammation: Secondary | ICD-10-CM | POA: Diagnosis not present

## 2015-05-26 DIAGNOSIS — I83218 Varicose veins of right lower extremity with both ulcer of other part of lower extremity and inflammation: Secondary | ICD-10-CM | POA: Diagnosis not present

## 2015-05-26 DIAGNOSIS — L97829 Non-pressure chronic ulcer of other part of left lower leg with unspecified severity: Secondary | ICD-10-CM | POA: Diagnosis not present

## 2015-05-28 DIAGNOSIS — L03115 Cellulitis of right lower limb: Secondary | ICD-10-CM | POA: Diagnosis not present

## 2015-05-28 DIAGNOSIS — L97829 Non-pressure chronic ulcer of other part of left lower leg with unspecified severity: Secondary | ICD-10-CM | POA: Diagnosis not present

## 2015-05-28 DIAGNOSIS — I83218 Varicose veins of right lower extremity with both ulcer of other part of lower extremity and inflammation: Secondary | ICD-10-CM | POA: Diagnosis not present

## 2015-05-28 DIAGNOSIS — L97819 Non-pressure chronic ulcer of other part of right lower leg with unspecified severity: Secondary | ICD-10-CM | POA: Diagnosis not present

## 2015-05-28 DIAGNOSIS — L03116 Cellulitis of left lower limb: Secondary | ICD-10-CM | POA: Diagnosis not present

## 2015-05-28 DIAGNOSIS — I83228 Varicose veins of left lower extremity with both ulcer of other part of lower extremity and inflammation: Secondary | ICD-10-CM | POA: Diagnosis not present

## 2015-05-31 DIAGNOSIS — L97819 Non-pressure chronic ulcer of other part of right lower leg with unspecified severity: Secondary | ICD-10-CM | POA: Diagnosis not present

## 2015-05-31 DIAGNOSIS — L97829 Non-pressure chronic ulcer of other part of left lower leg with unspecified severity: Secondary | ICD-10-CM | POA: Diagnosis not present

## 2015-05-31 DIAGNOSIS — I83228 Varicose veins of left lower extremity with both ulcer of other part of lower extremity and inflammation: Secondary | ICD-10-CM | POA: Diagnosis not present

## 2015-05-31 DIAGNOSIS — I83218 Varicose veins of right lower extremity with both ulcer of other part of lower extremity and inflammation: Secondary | ICD-10-CM | POA: Diagnosis not present

## 2015-05-31 DIAGNOSIS — L03115 Cellulitis of right lower limb: Secondary | ICD-10-CM | POA: Diagnosis not present

## 2015-05-31 DIAGNOSIS — L03116 Cellulitis of left lower limb: Secondary | ICD-10-CM | POA: Diagnosis not present

## 2015-06-01 DIAGNOSIS — L03115 Cellulitis of right lower limb: Secondary | ICD-10-CM | POA: Diagnosis not present

## 2015-06-01 DIAGNOSIS — L97819 Non-pressure chronic ulcer of other part of right lower leg with unspecified severity: Secondary | ICD-10-CM | POA: Diagnosis not present

## 2015-06-01 DIAGNOSIS — L03116 Cellulitis of left lower limb: Secondary | ICD-10-CM | POA: Diagnosis not present

## 2015-06-01 DIAGNOSIS — I83218 Varicose veins of right lower extremity with both ulcer of other part of lower extremity and inflammation: Secondary | ICD-10-CM | POA: Diagnosis not present

## 2015-06-01 DIAGNOSIS — I83228 Varicose veins of left lower extremity with both ulcer of other part of lower extremity and inflammation: Secondary | ICD-10-CM | POA: Diagnosis not present

## 2015-06-01 DIAGNOSIS — L97829 Non-pressure chronic ulcer of other part of left lower leg with unspecified severity: Secondary | ICD-10-CM | POA: Diagnosis not present

## 2015-06-03 DIAGNOSIS — L03116 Cellulitis of left lower limb: Secondary | ICD-10-CM | POA: Diagnosis not present

## 2015-06-03 DIAGNOSIS — L03115 Cellulitis of right lower limb: Secondary | ICD-10-CM | POA: Diagnosis not present

## 2015-06-03 DIAGNOSIS — I83218 Varicose veins of right lower extremity with both ulcer of other part of lower extremity and inflammation: Secondary | ICD-10-CM | POA: Diagnosis not present

## 2015-06-03 DIAGNOSIS — I83228 Varicose veins of left lower extremity with both ulcer of other part of lower extremity and inflammation: Secondary | ICD-10-CM | POA: Diagnosis not present

## 2015-06-03 DIAGNOSIS — L97819 Non-pressure chronic ulcer of other part of right lower leg with unspecified severity: Secondary | ICD-10-CM | POA: Diagnosis not present

## 2015-06-03 DIAGNOSIS — L97829 Non-pressure chronic ulcer of other part of left lower leg with unspecified severity: Secondary | ICD-10-CM | POA: Diagnosis not present

## 2015-06-04 DIAGNOSIS — L03116 Cellulitis of left lower limb: Secondary | ICD-10-CM | POA: Diagnosis not present

## 2015-06-04 DIAGNOSIS — L97819 Non-pressure chronic ulcer of other part of right lower leg with unspecified severity: Secondary | ICD-10-CM | POA: Diagnosis not present

## 2015-06-04 DIAGNOSIS — I83228 Varicose veins of left lower extremity with both ulcer of other part of lower extremity and inflammation: Secondary | ICD-10-CM | POA: Diagnosis not present

## 2015-06-04 DIAGNOSIS — L03115 Cellulitis of right lower limb: Secondary | ICD-10-CM | POA: Diagnosis not present

## 2015-06-04 DIAGNOSIS — I83218 Varicose veins of right lower extremity with both ulcer of other part of lower extremity and inflammation: Secondary | ICD-10-CM | POA: Diagnosis not present

## 2015-06-04 DIAGNOSIS — L97829 Non-pressure chronic ulcer of other part of left lower leg with unspecified severity: Secondary | ICD-10-CM | POA: Diagnosis not present

## 2015-06-08 DIAGNOSIS — L97819 Non-pressure chronic ulcer of other part of right lower leg with unspecified severity: Secondary | ICD-10-CM | POA: Diagnosis not present

## 2015-06-08 DIAGNOSIS — L03116 Cellulitis of left lower limb: Secondary | ICD-10-CM | POA: Diagnosis not present

## 2015-06-08 DIAGNOSIS — I83228 Varicose veins of left lower extremity with both ulcer of other part of lower extremity and inflammation: Secondary | ICD-10-CM | POA: Diagnosis not present

## 2015-06-08 DIAGNOSIS — L03115 Cellulitis of right lower limb: Secondary | ICD-10-CM | POA: Diagnosis not present

## 2015-06-08 DIAGNOSIS — I83218 Varicose veins of right lower extremity with both ulcer of other part of lower extremity and inflammation: Secondary | ICD-10-CM | POA: Diagnosis not present

## 2015-06-08 DIAGNOSIS — L97829 Non-pressure chronic ulcer of other part of left lower leg with unspecified severity: Secondary | ICD-10-CM | POA: Diagnosis not present

## 2015-06-09 DIAGNOSIS — L03115 Cellulitis of right lower limb: Secondary | ICD-10-CM | POA: Diagnosis not present

## 2015-06-09 DIAGNOSIS — L97819 Non-pressure chronic ulcer of other part of right lower leg with unspecified severity: Secondary | ICD-10-CM | POA: Diagnosis not present

## 2015-06-09 DIAGNOSIS — L03116 Cellulitis of left lower limb: Secondary | ICD-10-CM | POA: Diagnosis not present

## 2015-06-09 DIAGNOSIS — I83228 Varicose veins of left lower extremity with both ulcer of other part of lower extremity and inflammation: Secondary | ICD-10-CM | POA: Diagnosis not present

## 2015-06-09 DIAGNOSIS — L97829 Non-pressure chronic ulcer of other part of left lower leg with unspecified severity: Secondary | ICD-10-CM | POA: Diagnosis not present

## 2015-06-09 DIAGNOSIS — I83218 Varicose veins of right lower extremity with both ulcer of other part of lower extremity and inflammation: Secondary | ICD-10-CM | POA: Diagnosis not present

## 2015-06-11 DIAGNOSIS — L97829 Non-pressure chronic ulcer of other part of left lower leg with unspecified severity: Secondary | ICD-10-CM | POA: Diagnosis not present

## 2015-06-11 DIAGNOSIS — L03115 Cellulitis of right lower limb: Secondary | ICD-10-CM | POA: Diagnosis not present

## 2015-06-11 DIAGNOSIS — I83218 Varicose veins of right lower extremity with both ulcer of other part of lower extremity and inflammation: Secondary | ICD-10-CM | POA: Diagnosis not present

## 2015-06-11 DIAGNOSIS — I83228 Varicose veins of left lower extremity with both ulcer of other part of lower extremity and inflammation: Secondary | ICD-10-CM | POA: Diagnosis not present

## 2015-06-11 DIAGNOSIS — L03116 Cellulitis of left lower limb: Secondary | ICD-10-CM | POA: Diagnosis not present

## 2015-06-11 DIAGNOSIS — L97819 Non-pressure chronic ulcer of other part of right lower leg with unspecified severity: Secondary | ICD-10-CM | POA: Diagnosis not present

## 2015-06-15 DIAGNOSIS — I83218 Varicose veins of right lower extremity with both ulcer of other part of lower extremity and inflammation: Secondary | ICD-10-CM | POA: Diagnosis not present

## 2015-06-15 DIAGNOSIS — I83228 Varicose veins of left lower extremity with both ulcer of other part of lower extremity and inflammation: Secondary | ICD-10-CM | POA: Diagnosis not present

## 2015-06-15 DIAGNOSIS — L97819 Non-pressure chronic ulcer of other part of right lower leg with unspecified severity: Secondary | ICD-10-CM | POA: Diagnosis not present

## 2015-06-15 DIAGNOSIS — L03115 Cellulitis of right lower limb: Secondary | ICD-10-CM | POA: Diagnosis not present

## 2015-06-15 DIAGNOSIS — L97829 Non-pressure chronic ulcer of other part of left lower leg with unspecified severity: Secondary | ICD-10-CM | POA: Diagnosis not present

## 2015-06-15 DIAGNOSIS — L03116 Cellulitis of left lower limb: Secondary | ICD-10-CM | POA: Diagnosis not present

## 2015-06-17 DIAGNOSIS — L97819 Non-pressure chronic ulcer of other part of right lower leg with unspecified severity: Secondary | ICD-10-CM | POA: Diagnosis not present

## 2015-06-17 DIAGNOSIS — L97829 Non-pressure chronic ulcer of other part of left lower leg with unspecified severity: Secondary | ICD-10-CM | POA: Diagnosis not present

## 2015-06-17 DIAGNOSIS — L03115 Cellulitis of right lower limb: Secondary | ICD-10-CM | POA: Diagnosis not present

## 2015-06-17 DIAGNOSIS — I83218 Varicose veins of right lower extremity with both ulcer of other part of lower extremity and inflammation: Secondary | ICD-10-CM | POA: Diagnosis not present

## 2015-06-17 DIAGNOSIS — L03116 Cellulitis of left lower limb: Secondary | ICD-10-CM | POA: Diagnosis not present

## 2015-06-17 DIAGNOSIS — I83228 Varicose veins of left lower extremity with both ulcer of other part of lower extremity and inflammation: Secondary | ICD-10-CM | POA: Diagnosis not present

## 2015-06-21 DIAGNOSIS — L03116 Cellulitis of left lower limb: Secondary | ICD-10-CM | POA: Diagnosis not present

## 2015-06-21 DIAGNOSIS — I83218 Varicose veins of right lower extremity with both ulcer of other part of lower extremity and inflammation: Secondary | ICD-10-CM | POA: Diagnosis not present

## 2015-06-21 DIAGNOSIS — L97819 Non-pressure chronic ulcer of other part of right lower leg with unspecified severity: Secondary | ICD-10-CM | POA: Diagnosis not present

## 2015-06-21 DIAGNOSIS — I83228 Varicose veins of left lower extremity with both ulcer of other part of lower extremity and inflammation: Secondary | ICD-10-CM | POA: Diagnosis not present

## 2015-06-21 DIAGNOSIS — L03115 Cellulitis of right lower limb: Secondary | ICD-10-CM | POA: Diagnosis not present

## 2015-06-21 DIAGNOSIS — L97829 Non-pressure chronic ulcer of other part of left lower leg with unspecified severity: Secondary | ICD-10-CM | POA: Diagnosis not present

## 2015-06-23 DIAGNOSIS — I83218 Varicose veins of right lower extremity with both ulcer of other part of lower extremity and inflammation: Secondary | ICD-10-CM | POA: Diagnosis not present

## 2015-06-23 DIAGNOSIS — L97829 Non-pressure chronic ulcer of other part of left lower leg with unspecified severity: Secondary | ICD-10-CM | POA: Diagnosis not present

## 2015-06-23 DIAGNOSIS — L03115 Cellulitis of right lower limb: Secondary | ICD-10-CM | POA: Diagnosis not present

## 2015-06-23 DIAGNOSIS — I83228 Varicose veins of left lower extremity with both ulcer of other part of lower extremity and inflammation: Secondary | ICD-10-CM | POA: Diagnosis not present

## 2015-06-23 DIAGNOSIS — L03116 Cellulitis of left lower limb: Secondary | ICD-10-CM | POA: Diagnosis not present

## 2015-06-23 DIAGNOSIS — L97819 Non-pressure chronic ulcer of other part of right lower leg with unspecified severity: Secondary | ICD-10-CM | POA: Diagnosis not present

## 2015-07-16 DIAGNOSIS — I10 Essential (primary) hypertension: Secondary | ICD-10-CM | POA: Diagnosis not present

## 2015-07-16 DIAGNOSIS — R404 Transient alteration of awareness: Secondary | ICD-10-CM | POA: Diagnosis not present

## 2015-07-16 DIAGNOSIS — Z79899 Other long term (current) drug therapy: Secondary | ICD-10-CM | POA: Diagnosis not present

## 2015-07-16 DIAGNOSIS — J984 Other disorders of lung: Secondary | ICD-10-CM | POA: Diagnosis not present

## 2015-07-16 DIAGNOSIS — L89302 Pressure ulcer of unspecified buttock, stage 2: Secondary | ICD-10-CM | POA: Diagnosis present

## 2015-07-16 DIAGNOSIS — K922 Gastrointestinal hemorrhage, unspecified: Secondary | ICD-10-CM | POA: Diagnosis not present

## 2015-07-16 DIAGNOSIS — J441 Chronic obstructive pulmonary disease with (acute) exacerbation: Secondary | ICD-10-CM | POA: Diagnosis not present

## 2015-07-16 DIAGNOSIS — Z801 Family history of malignant neoplasm of trachea, bronchus and lung: Secondary | ICD-10-CM | POA: Diagnosis not present

## 2015-07-16 DIAGNOSIS — R041 Hemorrhage from throat: Secondary | ICD-10-CM | POA: Diagnosis not present

## 2015-07-16 DIAGNOSIS — E876 Hypokalemia: Secondary | ICD-10-CM | POA: Diagnosis not present

## 2015-07-16 DIAGNOSIS — J449 Chronic obstructive pulmonary disease, unspecified: Secondary | ICD-10-CM | POA: Diagnosis not present

## 2015-07-16 DIAGNOSIS — E54 Ascorbic acid deficiency: Secondary | ICD-10-CM | POA: Diagnosis not present

## 2015-07-16 DIAGNOSIS — R531 Weakness: Secondary | ICD-10-CM | POA: Diagnosis not present

## 2015-07-16 DIAGNOSIS — K5900 Constipation, unspecified: Secondary | ICD-10-CM | POA: Diagnosis not present

## 2015-07-16 DIAGNOSIS — E785 Hyperlipidemia, unspecified: Secondary | ICD-10-CM | POA: Diagnosis present

## 2015-07-16 DIAGNOSIS — F329 Major depressive disorder, single episode, unspecified: Secondary | ICD-10-CM | POA: Diagnosis not present

## 2015-07-16 DIAGNOSIS — Z780 Asymptomatic menopausal state: Secondary | ICD-10-CM | POA: Diagnosis not present

## 2015-07-16 DIAGNOSIS — K068 Other specified disorders of gingiva and edentulous alveolar ridge: Secondary | ICD-10-CM | POA: Diagnosis not present

## 2015-07-16 DIAGNOSIS — T45515A Adverse effect of anticoagulants, initial encounter: Secondary | ICD-10-CM | POA: Diagnosis not present

## 2015-07-16 DIAGNOSIS — D649 Anemia, unspecified: Secondary | ICD-10-CM | POA: Diagnosis not present

## 2015-07-16 DIAGNOSIS — R2689 Other abnormalities of gait and mobility: Secondary | ICD-10-CM | POA: Diagnosis not present

## 2015-07-16 DIAGNOSIS — D62 Acute posthemorrhagic anemia: Secondary | ICD-10-CM | POA: Diagnosis not present

## 2015-07-16 DIAGNOSIS — R7989 Other specified abnormal findings of blood chemistry: Secondary | ICD-10-CM | POA: Diagnosis not present

## 2015-07-16 DIAGNOSIS — Z8249 Family history of ischemic heart disease and other diseases of the circulatory system: Secondary | ICD-10-CM | POA: Diagnosis not present

## 2015-07-16 DIAGNOSIS — M6281 Muscle weakness (generalized): Secondary | ICD-10-CM | POA: Diagnosis not present

## 2015-07-16 DIAGNOSIS — F419 Anxiety disorder, unspecified: Secondary | ICD-10-CM | POA: Diagnosis not present

## 2015-07-16 DIAGNOSIS — R278 Other lack of coordination: Secondary | ICD-10-CM | POA: Diagnosis not present

## 2015-07-16 DIAGNOSIS — Z7401 Bed confinement status: Secondary | ICD-10-CM | POA: Diagnosis not present

## 2015-07-16 DIAGNOSIS — R627 Adult failure to thrive: Secondary | ICD-10-CM | POA: Diagnosis present

## 2015-07-16 DIAGNOSIS — K1370 Unspecified lesions of oral mucosa: Secondary | ICD-10-CM | POA: Diagnosis not present

## 2015-07-16 DIAGNOSIS — R279 Unspecified lack of coordination: Secondary | ICD-10-CM | POA: Diagnosis not present

## 2015-07-16 DIAGNOSIS — D689 Coagulation defect, unspecified: Secondary | ICD-10-CM | POA: Diagnosis not present

## 2015-07-16 DIAGNOSIS — K219 Gastro-esophageal reflux disease without esophagitis: Secondary | ICD-10-CM | POA: Diagnosis not present

## 2015-07-16 DIAGNOSIS — K625 Hemorrhage of anus and rectum: Secondary | ICD-10-CM | POA: Diagnosis not present

## 2015-07-17 DIAGNOSIS — K625 Hemorrhage of anus and rectum: Secondary | ICD-10-CM | POA: Diagnosis not present

## 2015-07-17 DIAGNOSIS — J441 Chronic obstructive pulmonary disease with (acute) exacerbation: Secondary | ICD-10-CM | POA: Diagnosis not present

## 2015-07-17 DIAGNOSIS — E54 Ascorbic acid deficiency: Secondary | ICD-10-CM | POA: Diagnosis not present

## 2015-07-17 DIAGNOSIS — T45515A Adverse effect of anticoagulants, initial encounter: Secondary | ICD-10-CM | POA: Diagnosis not present

## 2015-07-17 DIAGNOSIS — D62 Acute posthemorrhagic anemia: Secondary | ICD-10-CM | POA: Diagnosis not present

## 2015-07-23 DIAGNOSIS — K625 Hemorrhage of anus and rectum: Secondary | ICD-10-CM | POA: Diagnosis not present

## 2015-07-23 DIAGNOSIS — R2689 Other abnormalities of gait and mobility: Secondary | ICD-10-CM | POA: Diagnosis not present

## 2015-07-23 DIAGNOSIS — K219 Gastro-esophageal reflux disease without esophagitis: Secondary | ICD-10-CM | POA: Diagnosis not present

## 2015-07-23 DIAGNOSIS — D649 Anemia, unspecified: Secondary | ICD-10-CM | POA: Diagnosis not present

## 2015-07-23 DIAGNOSIS — J984 Other disorders of lung: Secondary | ICD-10-CM | POA: Diagnosis not present

## 2015-07-23 DIAGNOSIS — R278 Other lack of coordination: Secondary | ICD-10-CM | POA: Diagnosis not present

## 2015-07-23 DIAGNOSIS — T45515A Adverse effect of anticoagulants, initial encounter: Secondary | ICD-10-CM | POA: Diagnosis not present

## 2015-07-23 DIAGNOSIS — I10 Essential (primary) hypertension: Secondary | ICD-10-CM | POA: Diagnosis not present

## 2015-07-23 DIAGNOSIS — Z780 Asymptomatic menopausal state: Secondary | ICD-10-CM | POA: Diagnosis not present

## 2015-07-23 DIAGNOSIS — F419 Anxiety disorder, unspecified: Secondary | ICD-10-CM | POA: Diagnosis not present

## 2015-07-23 DIAGNOSIS — E785 Hyperlipidemia, unspecified: Secondary | ICD-10-CM | POA: Diagnosis not present

## 2015-07-23 DIAGNOSIS — K5900 Constipation, unspecified: Secondary | ICD-10-CM | POA: Diagnosis not present

## 2015-07-23 DIAGNOSIS — E54 Ascorbic acid deficiency: Secondary | ICD-10-CM | POA: Diagnosis not present

## 2015-07-23 DIAGNOSIS — J449 Chronic obstructive pulmonary disease, unspecified: Secondary | ICD-10-CM | POA: Diagnosis not present

## 2015-07-23 DIAGNOSIS — R279 Unspecified lack of coordination: Secondary | ICD-10-CM | POA: Diagnosis not present

## 2015-07-23 DIAGNOSIS — M6281 Muscle weakness (generalized): Secondary | ICD-10-CM | POA: Diagnosis not present

## 2015-07-23 DIAGNOSIS — Z7401 Bed confinement status: Secondary | ICD-10-CM | POA: Diagnosis not present

## 2015-07-23 DIAGNOSIS — J441 Chronic obstructive pulmonary disease with (acute) exacerbation: Secondary | ICD-10-CM | POA: Diagnosis not present

## 2015-07-23 DIAGNOSIS — D689 Coagulation defect, unspecified: Secondary | ICD-10-CM | POA: Diagnosis not present

## 2015-07-23 DIAGNOSIS — D62 Acute posthemorrhagic anemia: Secondary | ICD-10-CM | POA: Diagnosis not present

## 2015-07-23 DIAGNOSIS — F329 Major depressive disorder, single episode, unspecified: Secondary | ICD-10-CM | POA: Diagnosis not present

## 2015-09-15 DIAGNOSIS — I480 Paroxysmal atrial fibrillation: Secondary | ICD-10-CM | POA: Diagnosis not present

## 2015-09-15 DIAGNOSIS — R2689 Other abnormalities of gait and mobility: Secondary | ICD-10-CM | POA: Diagnosis not present

## 2015-09-15 DIAGNOSIS — F329 Major depressive disorder, single episode, unspecified: Secondary | ICD-10-CM | POA: Diagnosis not present

## 2015-09-15 DIAGNOSIS — D689 Coagulation defect, unspecified: Secondary | ICD-10-CM | POA: Diagnosis not present

## 2015-09-15 DIAGNOSIS — M6281 Muscle weakness (generalized): Secondary | ICD-10-CM | POA: Diagnosis not present

## 2015-09-15 DIAGNOSIS — F17210 Nicotine dependence, cigarettes, uncomplicated: Secondary | ICD-10-CM | POA: Diagnosis not present

## 2015-09-15 DIAGNOSIS — J449 Chronic obstructive pulmonary disease, unspecified: Secondary | ICD-10-CM | POA: Diagnosis not present

## 2015-09-15 DIAGNOSIS — Z96642 Presence of left artificial hip joint: Secondary | ICD-10-CM | POA: Diagnosis not present

## 2015-09-15 DIAGNOSIS — F419 Anxiety disorder, unspecified: Secondary | ICD-10-CM | POA: Diagnosis not present

## 2015-09-15 DIAGNOSIS — K922 Gastrointestinal hemorrhage, unspecified: Secondary | ICD-10-CM | POA: Diagnosis not present

## 2015-09-15 DIAGNOSIS — I10 Essential (primary) hypertension: Secondary | ICD-10-CM | POA: Diagnosis not present

## 2015-09-16 DIAGNOSIS — I480 Paroxysmal atrial fibrillation: Secondary | ICD-10-CM | POA: Diagnosis not present

## 2015-09-16 DIAGNOSIS — D689 Coagulation defect, unspecified: Secondary | ICD-10-CM | POA: Diagnosis not present

## 2015-09-16 DIAGNOSIS — J449 Chronic obstructive pulmonary disease, unspecified: Secondary | ICD-10-CM | POA: Diagnosis not present

## 2015-09-16 DIAGNOSIS — I10 Essential (primary) hypertension: Secondary | ICD-10-CM | POA: Diagnosis not present

## 2015-09-16 DIAGNOSIS — F329 Major depressive disorder, single episode, unspecified: Secondary | ICD-10-CM | POA: Diagnosis not present

## 2015-09-16 DIAGNOSIS — R2689 Other abnormalities of gait and mobility: Secondary | ICD-10-CM | POA: Diagnosis not present

## 2015-09-20 DIAGNOSIS — I480 Paroxysmal atrial fibrillation: Secondary | ICD-10-CM | POA: Diagnosis not present

## 2015-09-20 DIAGNOSIS — I10 Essential (primary) hypertension: Secondary | ICD-10-CM | POA: Diagnosis not present

## 2015-09-20 DIAGNOSIS — R2689 Other abnormalities of gait and mobility: Secondary | ICD-10-CM | POA: Diagnosis not present

## 2015-09-20 DIAGNOSIS — J449 Chronic obstructive pulmonary disease, unspecified: Secondary | ICD-10-CM | POA: Diagnosis not present

## 2015-09-20 DIAGNOSIS — F329 Major depressive disorder, single episode, unspecified: Secondary | ICD-10-CM | POA: Diagnosis not present

## 2015-09-20 DIAGNOSIS — D689 Coagulation defect, unspecified: Secondary | ICD-10-CM | POA: Diagnosis not present

## 2015-09-22 DIAGNOSIS — I480 Paroxysmal atrial fibrillation: Secondary | ICD-10-CM | POA: Diagnosis not present

## 2015-09-22 DIAGNOSIS — J449 Chronic obstructive pulmonary disease, unspecified: Secondary | ICD-10-CM | POA: Diagnosis not present

## 2015-09-22 DIAGNOSIS — F329 Major depressive disorder, single episode, unspecified: Secondary | ICD-10-CM | POA: Diagnosis not present

## 2015-09-22 DIAGNOSIS — R2689 Other abnormalities of gait and mobility: Secondary | ICD-10-CM | POA: Diagnosis not present

## 2015-09-22 DIAGNOSIS — D689 Coagulation defect, unspecified: Secondary | ICD-10-CM | POA: Diagnosis not present

## 2015-09-22 DIAGNOSIS — I10 Essential (primary) hypertension: Secondary | ICD-10-CM | POA: Diagnosis not present

## 2015-09-23 DIAGNOSIS — L03116 Cellulitis of left lower limb: Secondary | ICD-10-CM | POA: Diagnosis not present

## 2015-09-23 DIAGNOSIS — J449 Chronic obstructive pulmonary disease, unspecified: Secondary | ICD-10-CM | POA: Diagnosis not present

## 2015-09-23 DIAGNOSIS — D689 Coagulation defect, unspecified: Secondary | ICD-10-CM | POA: Diagnosis not present

## 2015-09-23 DIAGNOSIS — I480 Paroxysmal atrial fibrillation: Secondary | ICD-10-CM | POA: Diagnosis not present

## 2015-09-23 DIAGNOSIS — F329 Major depressive disorder, single episode, unspecified: Secondary | ICD-10-CM | POA: Diagnosis not present

## 2015-09-23 DIAGNOSIS — R2689 Other abnormalities of gait and mobility: Secondary | ICD-10-CM | POA: Diagnosis not present

## 2015-09-23 DIAGNOSIS — Z0000 Encounter for general adult medical examination without abnormal findings: Secondary | ICD-10-CM | POA: Diagnosis not present

## 2015-09-23 DIAGNOSIS — I10 Essential (primary) hypertension: Secondary | ICD-10-CM | POA: Diagnosis not present

## 2015-09-24 DIAGNOSIS — I480 Paroxysmal atrial fibrillation: Secondary | ICD-10-CM | POA: Diagnosis not present

## 2015-09-24 DIAGNOSIS — D689 Coagulation defect, unspecified: Secondary | ICD-10-CM | POA: Diagnosis not present

## 2015-09-24 DIAGNOSIS — I10 Essential (primary) hypertension: Secondary | ICD-10-CM | POA: Diagnosis not present

## 2015-09-24 DIAGNOSIS — J449 Chronic obstructive pulmonary disease, unspecified: Secondary | ICD-10-CM | POA: Diagnosis not present

## 2015-09-24 DIAGNOSIS — R2689 Other abnormalities of gait and mobility: Secondary | ICD-10-CM | POA: Diagnosis not present

## 2015-09-24 DIAGNOSIS — F329 Major depressive disorder, single episode, unspecified: Secondary | ICD-10-CM | POA: Diagnosis not present

## 2015-09-27 DIAGNOSIS — F329 Major depressive disorder, single episode, unspecified: Secondary | ICD-10-CM | POA: Diagnosis not present

## 2015-09-27 DIAGNOSIS — D689 Coagulation defect, unspecified: Secondary | ICD-10-CM | POA: Diagnosis not present

## 2015-09-27 DIAGNOSIS — J449 Chronic obstructive pulmonary disease, unspecified: Secondary | ICD-10-CM | POA: Diagnosis not present

## 2015-09-27 DIAGNOSIS — R2689 Other abnormalities of gait and mobility: Secondary | ICD-10-CM | POA: Diagnosis not present

## 2015-09-27 DIAGNOSIS — I480 Paroxysmal atrial fibrillation: Secondary | ICD-10-CM | POA: Diagnosis not present

## 2015-09-27 DIAGNOSIS — I10 Essential (primary) hypertension: Secondary | ICD-10-CM | POA: Diagnosis not present

## 2015-09-28 DIAGNOSIS — R2689 Other abnormalities of gait and mobility: Secondary | ICD-10-CM | POA: Diagnosis not present

## 2015-09-28 DIAGNOSIS — D689 Coagulation defect, unspecified: Secondary | ICD-10-CM | POA: Diagnosis not present

## 2015-09-28 DIAGNOSIS — I10 Essential (primary) hypertension: Secondary | ICD-10-CM | POA: Diagnosis not present

## 2015-09-28 DIAGNOSIS — F329 Major depressive disorder, single episode, unspecified: Secondary | ICD-10-CM | POA: Diagnosis not present

## 2015-09-28 DIAGNOSIS — J449 Chronic obstructive pulmonary disease, unspecified: Secondary | ICD-10-CM | POA: Diagnosis not present

## 2015-09-28 DIAGNOSIS — I480 Paroxysmal atrial fibrillation: Secondary | ICD-10-CM | POA: Diagnosis not present

## 2015-09-30 DIAGNOSIS — I480 Paroxysmal atrial fibrillation: Secondary | ICD-10-CM | POA: Diagnosis not present

## 2015-09-30 DIAGNOSIS — F329 Major depressive disorder, single episode, unspecified: Secondary | ICD-10-CM | POA: Diagnosis not present

## 2015-09-30 DIAGNOSIS — J449 Chronic obstructive pulmonary disease, unspecified: Secondary | ICD-10-CM | POA: Diagnosis not present

## 2015-09-30 DIAGNOSIS — R2689 Other abnormalities of gait and mobility: Secondary | ICD-10-CM | POA: Diagnosis not present

## 2015-09-30 DIAGNOSIS — I10 Essential (primary) hypertension: Secondary | ICD-10-CM | POA: Diagnosis not present

## 2015-09-30 DIAGNOSIS — D689 Coagulation defect, unspecified: Secondary | ICD-10-CM | POA: Diagnosis not present

## 2015-10-01 DIAGNOSIS — I10 Essential (primary) hypertension: Secondary | ICD-10-CM | POA: Diagnosis not present

## 2015-10-01 DIAGNOSIS — F329 Major depressive disorder, single episode, unspecified: Secondary | ICD-10-CM | POA: Diagnosis not present

## 2015-10-01 DIAGNOSIS — R2689 Other abnormalities of gait and mobility: Secondary | ICD-10-CM | POA: Diagnosis not present

## 2015-10-01 DIAGNOSIS — J449 Chronic obstructive pulmonary disease, unspecified: Secondary | ICD-10-CM | POA: Diagnosis not present

## 2015-10-01 DIAGNOSIS — D689 Coagulation defect, unspecified: Secondary | ICD-10-CM | POA: Diagnosis not present

## 2015-10-01 DIAGNOSIS — I480 Paroxysmal atrial fibrillation: Secondary | ICD-10-CM | POA: Diagnosis not present

## 2015-10-05 DIAGNOSIS — I10 Essential (primary) hypertension: Secondary | ICD-10-CM | POA: Diagnosis not present

## 2015-10-05 DIAGNOSIS — R2689 Other abnormalities of gait and mobility: Secondary | ICD-10-CM | POA: Diagnosis not present

## 2015-10-05 DIAGNOSIS — D689 Coagulation defect, unspecified: Secondary | ICD-10-CM | POA: Diagnosis not present

## 2015-10-05 DIAGNOSIS — J449 Chronic obstructive pulmonary disease, unspecified: Secondary | ICD-10-CM | POA: Diagnosis not present

## 2015-10-05 DIAGNOSIS — I480 Paroxysmal atrial fibrillation: Secondary | ICD-10-CM | POA: Diagnosis not present

## 2015-10-05 DIAGNOSIS — F329 Major depressive disorder, single episode, unspecified: Secondary | ICD-10-CM | POA: Diagnosis not present

## 2015-10-06 DIAGNOSIS — R2689 Other abnormalities of gait and mobility: Secondary | ICD-10-CM | POA: Diagnosis not present

## 2015-10-06 DIAGNOSIS — D689 Coagulation defect, unspecified: Secondary | ICD-10-CM | POA: Diagnosis not present

## 2015-10-06 DIAGNOSIS — J449 Chronic obstructive pulmonary disease, unspecified: Secondary | ICD-10-CM | POA: Diagnosis not present

## 2015-10-06 DIAGNOSIS — F329 Major depressive disorder, single episode, unspecified: Secondary | ICD-10-CM | POA: Diagnosis not present

## 2015-10-06 DIAGNOSIS — I10 Essential (primary) hypertension: Secondary | ICD-10-CM | POA: Diagnosis not present

## 2015-10-06 DIAGNOSIS — I480 Paroxysmal atrial fibrillation: Secondary | ICD-10-CM | POA: Diagnosis not present

## 2015-10-07 DIAGNOSIS — I480 Paroxysmal atrial fibrillation: Secondary | ICD-10-CM | POA: Diagnosis not present

## 2015-10-07 DIAGNOSIS — I10 Essential (primary) hypertension: Secondary | ICD-10-CM | POA: Diagnosis not present

## 2015-10-07 DIAGNOSIS — R2689 Other abnormalities of gait and mobility: Secondary | ICD-10-CM | POA: Diagnosis not present

## 2015-10-07 DIAGNOSIS — J449 Chronic obstructive pulmonary disease, unspecified: Secondary | ICD-10-CM | POA: Diagnosis not present

## 2015-10-07 DIAGNOSIS — F329 Major depressive disorder, single episode, unspecified: Secondary | ICD-10-CM | POA: Diagnosis not present

## 2015-10-07 DIAGNOSIS — D689 Coagulation defect, unspecified: Secondary | ICD-10-CM | POA: Diagnosis not present

## 2015-10-12 DIAGNOSIS — F329 Major depressive disorder, single episode, unspecified: Secondary | ICD-10-CM | POA: Diagnosis not present

## 2015-10-12 DIAGNOSIS — J449 Chronic obstructive pulmonary disease, unspecified: Secondary | ICD-10-CM | POA: Diagnosis not present

## 2015-10-12 DIAGNOSIS — R2689 Other abnormalities of gait and mobility: Secondary | ICD-10-CM | POA: Diagnosis not present

## 2015-10-12 DIAGNOSIS — I480 Paroxysmal atrial fibrillation: Secondary | ICD-10-CM | POA: Diagnosis not present

## 2015-10-12 DIAGNOSIS — I10 Essential (primary) hypertension: Secondary | ICD-10-CM | POA: Diagnosis not present

## 2015-10-12 DIAGNOSIS — D689 Coagulation defect, unspecified: Secondary | ICD-10-CM | POA: Diagnosis not present

## 2015-10-13 DIAGNOSIS — D689 Coagulation defect, unspecified: Secondary | ICD-10-CM | POA: Diagnosis not present

## 2015-10-13 DIAGNOSIS — R2689 Other abnormalities of gait and mobility: Secondary | ICD-10-CM | POA: Diagnosis not present

## 2015-10-13 DIAGNOSIS — J449 Chronic obstructive pulmonary disease, unspecified: Secondary | ICD-10-CM | POA: Diagnosis not present

## 2015-10-13 DIAGNOSIS — I480 Paroxysmal atrial fibrillation: Secondary | ICD-10-CM | POA: Diagnosis not present

## 2015-10-13 DIAGNOSIS — F329 Major depressive disorder, single episode, unspecified: Secondary | ICD-10-CM | POA: Diagnosis not present

## 2015-10-13 DIAGNOSIS — I10 Essential (primary) hypertension: Secondary | ICD-10-CM | POA: Diagnosis not present

## 2015-10-14 DIAGNOSIS — L97821 Non-pressure chronic ulcer of other part of left lower leg limited to breakdown of skin: Secondary | ICD-10-CM | POA: Diagnosis not present

## 2015-10-14 DIAGNOSIS — I509 Heart failure, unspecified: Secondary | ICD-10-CM | POA: Diagnosis not present

## 2015-10-14 DIAGNOSIS — I83028 Varicose veins of left lower extremity with ulcer other part of lower leg: Secondary | ICD-10-CM | POA: Diagnosis not present

## 2015-10-14 DIAGNOSIS — F17210 Nicotine dependence, cigarettes, uncomplicated: Secondary | ICD-10-CM | POA: Diagnosis not present

## 2015-10-14 DIAGNOSIS — I872 Venous insufficiency (chronic) (peripheral): Secondary | ICD-10-CM | POA: Diagnosis not present

## 2015-10-14 DIAGNOSIS — I110 Hypertensive heart disease with heart failure: Secondary | ICD-10-CM | POA: Diagnosis not present

## 2015-10-14 DIAGNOSIS — Z79899 Other long term (current) drug therapy: Secondary | ICD-10-CM | POA: Diagnosis not present

## 2015-10-14 DIAGNOSIS — Z7982 Long term (current) use of aspirin: Secondary | ICD-10-CM | POA: Diagnosis not present

## 2015-10-14 DIAGNOSIS — E8770 Fluid overload, unspecified: Secondary | ICD-10-CM | POA: Diagnosis not present

## 2015-10-14 DIAGNOSIS — L97829 Non-pressure chronic ulcer of other part of left lower leg with unspecified severity: Secondary | ICD-10-CM | POA: Diagnosis not present

## 2015-10-15 DIAGNOSIS — I480 Paroxysmal atrial fibrillation: Secondary | ICD-10-CM | POA: Diagnosis not present

## 2015-10-15 DIAGNOSIS — I10 Essential (primary) hypertension: Secondary | ICD-10-CM | POA: Diagnosis not present

## 2015-10-15 DIAGNOSIS — D689 Coagulation defect, unspecified: Secondary | ICD-10-CM | POA: Diagnosis not present

## 2015-10-15 DIAGNOSIS — F329 Major depressive disorder, single episode, unspecified: Secondary | ICD-10-CM | POA: Diagnosis not present

## 2015-10-15 DIAGNOSIS — J449 Chronic obstructive pulmonary disease, unspecified: Secondary | ICD-10-CM | POA: Diagnosis not present

## 2015-10-15 DIAGNOSIS — R2689 Other abnormalities of gait and mobility: Secondary | ICD-10-CM | POA: Diagnosis not present

## 2015-10-18 DIAGNOSIS — R2689 Other abnormalities of gait and mobility: Secondary | ICD-10-CM | POA: Diagnosis not present

## 2015-10-18 DIAGNOSIS — D689 Coagulation defect, unspecified: Secondary | ICD-10-CM | POA: Diagnosis not present

## 2015-10-18 DIAGNOSIS — I480 Paroxysmal atrial fibrillation: Secondary | ICD-10-CM | POA: Diagnosis not present

## 2015-10-18 DIAGNOSIS — J449 Chronic obstructive pulmonary disease, unspecified: Secondary | ICD-10-CM | POA: Diagnosis not present

## 2015-10-18 DIAGNOSIS — I10 Essential (primary) hypertension: Secondary | ICD-10-CM | POA: Diagnosis not present

## 2015-10-18 DIAGNOSIS — F329 Major depressive disorder, single episode, unspecified: Secondary | ICD-10-CM | POA: Diagnosis not present

## 2015-10-20 DIAGNOSIS — F329 Major depressive disorder, single episode, unspecified: Secondary | ICD-10-CM | POA: Diagnosis not present

## 2015-10-20 DIAGNOSIS — J449 Chronic obstructive pulmonary disease, unspecified: Secondary | ICD-10-CM | POA: Diagnosis not present

## 2015-10-20 DIAGNOSIS — D689 Coagulation defect, unspecified: Secondary | ICD-10-CM | POA: Diagnosis not present

## 2015-10-20 DIAGNOSIS — I480 Paroxysmal atrial fibrillation: Secondary | ICD-10-CM | POA: Diagnosis not present

## 2015-10-20 DIAGNOSIS — R2689 Other abnormalities of gait and mobility: Secondary | ICD-10-CM | POA: Diagnosis not present

## 2015-10-20 DIAGNOSIS — I10 Essential (primary) hypertension: Secondary | ICD-10-CM | POA: Diagnosis not present

## 2015-10-21 DIAGNOSIS — F17200 Nicotine dependence, unspecified, uncomplicated: Secondary | ICD-10-CM | POA: Diagnosis not present

## 2015-10-21 DIAGNOSIS — I83029 Varicose veins of left lower extremity with ulcer of unspecified site: Secondary | ICD-10-CM | POA: Diagnosis not present

## 2015-10-21 DIAGNOSIS — M79605 Pain in left leg: Secondary | ICD-10-CM | POA: Diagnosis not present

## 2015-10-21 DIAGNOSIS — I739 Peripheral vascular disease, unspecified: Secondary | ICD-10-CM | POA: Diagnosis not present

## 2015-10-22 DIAGNOSIS — J449 Chronic obstructive pulmonary disease, unspecified: Secondary | ICD-10-CM | POA: Diagnosis not present

## 2015-10-22 DIAGNOSIS — R2689 Other abnormalities of gait and mobility: Secondary | ICD-10-CM | POA: Diagnosis not present

## 2015-10-22 DIAGNOSIS — D689 Coagulation defect, unspecified: Secondary | ICD-10-CM | POA: Diagnosis not present

## 2015-10-22 DIAGNOSIS — F329 Major depressive disorder, single episode, unspecified: Secondary | ICD-10-CM | POA: Diagnosis not present

## 2015-10-22 DIAGNOSIS — I10 Essential (primary) hypertension: Secondary | ICD-10-CM | POA: Diagnosis not present

## 2015-10-22 DIAGNOSIS — I480 Paroxysmal atrial fibrillation: Secondary | ICD-10-CM | POA: Diagnosis not present

## 2015-10-25 DIAGNOSIS — D689 Coagulation defect, unspecified: Secondary | ICD-10-CM | POA: Diagnosis not present

## 2015-10-25 DIAGNOSIS — I10 Essential (primary) hypertension: Secondary | ICD-10-CM | POA: Diagnosis not present

## 2015-10-25 DIAGNOSIS — F329 Major depressive disorder, single episode, unspecified: Secondary | ICD-10-CM | POA: Diagnosis not present

## 2015-10-25 DIAGNOSIS — R2689 Other abnormalities of gait and mobility: Secondary | ICD-10-CM | POA: Diagnosis not present

## 2015-10-25 DIAGNOSIS — I480 Paroxysmal atrial fibrillation: Secondary | ICD-10-CM | POA: Diagnosis not present

## 2015-10-25 DIAGNOSIS — J449 Chronic obstructive pulmonary disease, unspecified: Secondary | ICD-10-CM | POA: Diagnosis not present

## 2015-10-27 DIAGNOSIS — F329 Major depressive disorder, single episode, unspecified: Secondary | ICD-10-CM | POA: Diagnosis not present

## 2015-10-27 DIAGNOSIS — D689 Coagulation defect, unspecified: Secondary | ICD-10-CM | POA: Diagnosis not present

## 2015-10-27 DIAGNOSIS — I10 Essential (primary) hypertension: Secondary | ICD-10-CM | POA: Diagnosis not present

## 2015-10-27 DIAGNOSIS — I480 Paroxysmal atrial fibrillation: Secondary | ICD-10-CM | POA: Diagnosis not present

## 2015-10-27 DIAGNOSIS — R2689 Other abnormalities of gait and mobility: Secondary | ICD-10-CM | POA: Diagnosis not present

## 2015-10-27 DIAGNOSIS — J449 Chronic obstructive pulmonary disease, unspecified: Secondary | ICD-10-CM | POA: Diagnosis not present

## 2015-10-28 DIAGNOSIS — I998 Other disorder of circulatory system: Secondary | ICD-10-CM | POA: Diagnosis not present

## 2015-10-28 DIAGNOSIS — L97824 Non-pressure chronic ulcer of other part of left lower leg with necrosis of bone: Secondary | ICD-10-CM | POA: Diagnosis not present

## 2015-10-28 DIAGNOSIS — L97829 Non-pressure chronic ulcer of other part of left lower leg with unspecified severity: Secondary | ICD-10-CM | POA: Diagnosis not present

## 2015-10-28 DIAGNOSIS — I70248 Atherosclerosis of native arteries of left leg with ulceration of other part of lower left leg: Secondary | ICD-10-CM | POA: Diagnosis not present

## 2015-10-29 DIAGNOSIS — F329 Major depressive disorder, single episode, unspecified: Secondary | ICD-10-CM | POA: Diagnosis not present

## 2015-10-29 DIAGNOSIS — R2689 Other abnormalities of gait and mobility: Secondary | ICD-10-CM | POA: Diagnosis not present

## 2015-10-29 DIAGNOSIS — D689 Coagulation defect, unspecified: Secondary | ICD-10-CM | POA: Diagnosis not present

## 2015-10-29 DIAGNOSIS — J449 Chronic obstructive pulmonary disease, unspecified: Secondary | ICD-10-CM | POA: Diagnosis not present

## 2015-10-29 DIAGNOSIS — I10 Essential (primary) hypertension: Secondary | ICD-10-CM | POA: Diagnosis not present

## 2015-10-29 DIAGNOSIS — I480 Paroxysmal atrial fibrillation: Secondary | ICD-10-CM | POA: Diagnosis not present

## 2015-11-01 DIAGNOSIS — I10 Essential (primary) hypertension: Secondary | ICD-10-CM | POA: Diagnosis not present

## 2015-11-01 DIAGNOSIS — F329 Major depressive disorder, single episode, unspecified: Secondary | ICD-10-CM | POA: Diagnosis not present

## 2015-11-01 DIAGNOSIS — R2689 Other abnormalities of gait and mobility: Secondary | ICD-10-CM | POA: Diagnosis not present

## 2015-11-01 DIAGNOSIS — D689 Coagulation defect, unspecified: Secondary | ICD-10-CM | POA: Diagnosis not present

## 2015-11-01 DIAGNOSIS — J449 Chronic obstructive pulmonary disease, unspecified: Secondary | ICD-10-CM | POA: Diagnosis not present

## 2015-11-01 DIAGNOSIS — I480 Paroxysmal atrial fibrillation: Secondary | ICD-10-CM | POA: Diagnosis not present

## 2015-11-02 ENCOUNTER — Other Ambulatory Visit: Payer: Self-pay

## 2015-11-02 DIAGNOSIS — I83009 Varicose veins of unspecified lower extremity with ulcer of unspecified site: Secondary | ICD-10-CM

## 2015-11-02 DIAGNOSIS — L97909 Non-pressure chronic ulcer of unspecified part of unspecified lower leg with unspecified severity: Principal | ICD-10-CM

## 2015-11-03 DIAGNOSIS — J449 Chronic obstructive pulmonary disease, unspecified: Secondary | ICD-10-CM | POA: Diagnosis not present

## 2015-11-03 DIAGNOSIS — I10 Essential (primary) hypertension: Secondary | ICD-10-CM | POA: Diagnosis not present

## 2015-11-03 DIAGNOSIS — D689 Coagulation defect, unspecified: Secondary | ICD-10-CM | POA: Diagnosis not present

## 2015-11-03 DIAGNOSIS — R2689 Other abnormalities of gait and mobility: Secondary | ICD-10-CM | POA: Diagnosis not present

## 2015-11-03 DIAGNOSIS — I480 Paroxysmal atrial fibrillation: Secondary | ICD-10-CM | POA: Diagnosis not present

## 2015-11-03 DIAGNOSIS — F329 Major depressive disorder, single episode, unspecified: Secondary | ICD-10-CM | POA: Diagnosis not present

## 2015-11-09 DIAGNOSIS — I480 Paroxysmal atrial fibrillation: Secondary | ICD-10-CM | POA: Diagnosis not present

## 2015-11-09 DIAGNOSIS — D689 Coagulation defect, unspecified: Secondary | ICD-10-CM | POA: Diagnosis not present

## 2015-11-09 DIAGNOSIS — I10 Essential (primary) hypertension: Secondary | ICD-10-CM | POA: Diagnosis not present

## 2015-11-09 DIAGNOSIS — R2689 Other abnormalities of gait and mobility: Secondary | ICD-10-CM | POA: Diagnosis not present

## 2015-11-09 DIAGNOSIS — J449 Chronic obstructive pulmonary disease, unspecified: Secondary | ICD-10-CM | POA: Diagnosis not present

## 2015-11-09 DIAGNOSIS — F329 Major depressive disorder, single episode, unspecified: Secondary | ICD-10-CM | POA: Diagnosis not present

## 2015-11-11 DIAGNOSIS — I10 Essential (primary) hypertension: Secondary | ICD-10-CM | POA: Diagnosis not present

## 2015-11-11 DIAGNOSIS — D689 Coagulation defect, unspecified: Secondary | ICD-10-CM | POA: Diagnosis not present

## 2015-11-11 DIAGNOSIS — R2689 Other abnormalities of gait and mobility: Secondary | ICD-10-CM | POA: Diagnosis not present

## 2015-11-11 DIAGNOSIS — F329 Major depressive disorder, single episode, unspecified: Secondary | ICD-10-CM | POA: Diagnosis not present

## 2015-11-11 DIAGNOSIS — J449 Chronic obstructive pulmonary disease, unspecified: Secondary | ICD-10-CM | POA: Diagnosis not present

## 2015-11-11 DIAGNOSIS — I480 Paroxysmal atrial fibrillation: Secondary | ICD-10-CM | POA: Diagnosis not present

## 2015-11-23 ENCOUNTER — Encounter: Payer: Self-pay | Admitting: Vascular Surgery

## 2015-11-24 ENCOUNTER — Ambulatory Visit (HOSPITAL_COMMUNITY)
Admission: RE | Admit: 2015-11-24 | Discharge: 2015-11-24 | Disposition: A | Discharge status: Home / Self Care | Payer: Medicare Other | Source: Ambulatory Visit | Attending: Vascular Surgery | Admitting: Vascular Surgery

## 2015-11-24 ENCOUNTER — Ambulatory Visit (INDEPENDENT_AMBULATORY_CARE_PROVIDER_SITE_OTHER)
Admission: RE | Admit: 2015-11-24 | Discharge: 2015-11-24 | Disposition: A | Discharge status: Home / Self Care | Payer: Medicare Other | Source: Ambulatory Visit | Attending: Family | Admitting: Family

## 2015-11-24 DIAGNOSIS — F17200 Nicotine dependence, unspecified, uncomplicated: Secondary | ICD-10-CM | POA: Insufficient documentation

## 2015-11-24 DIAGNOSIS — I83009 Varicose veins of unspecified lower extremity with ulcer of unspecified site: Secondary | ICD-10-CM | POA: Insufficient documentation

## 2015-11-24 DIAGNOSIS — L97909 Non-pressure chronic ulcer of unspecified part of unspecified lower leg with unspecified severity: Secondary | ICD-10-CM

## 2015-12-01 ENCOUNTER — Ambulatory Visit: Payer: Medicare Other | Admitting: Vascular Surgery

## 2015-12-06 ENCOUNTER — Encounter: Payer: Self-pay | Admitting: Vascular Surgery

## 2015-12-08 ENCOUNTER — Ambulatory Visit (INDEPENDENT_AMBULATORY_CARE_PROVIDER_SITE_OTHER): Payer: Medicare Other | Admitting: Vascular Surgery

## 2015-12-08 ENCOUNTER — Encounter: Payer: Self-pay | Admitting: Vascular Surgery

## 2015-12-08 VITALS — BP 156/76 | HR 79 | Temp 97.3°F | Resp 20 | Ht 65.5 in | Wt 97.0 lb

## 2015-12-08 DIAGNOSIS — I739 Peripheral vascular disease, unspecified: Secondary | ICD-10-CM

## 2015-12-08 NOTE — Progress Notes (Signed)
Vascular and Vein Specialist of [place]  Patient name: Yolanda Martin MRN: [id number] DOB: December 16, 1933 Sex: female  REASON FOR VISIT: left lower extremity ulcer. Referred by Dr. Mills Koller.  HPI: Yolanda Martin is a 82 y.o. female appendectomy was referred with a nonhealing wound of the left leg and peripheral vascular disease.  I have seen her in the past. She did have an arteriogram on 04/06/14 which showed mild diffuse disease of the infrarenal aorta and bilateral common iliac arteries but no focal stenosis. On the left side, which is the site of concern, there was a 4 cm total occlusion of the above-knee popliteal artery with reconstitution at the level of the patella. There were extensive collaterals. The below-knee popliteal artery was patent with three-vessel runoff on the left.  I have reviewed the records that were sent from the [facility] wound healing center. On 10/28/2015, he did describe a wound that measured 1.1 x 1.0 x 0.3 cm on the proximal lateral left lower leg  She did have an arterial Doppler study done on 10/21/2015 which showed an ABI of 96% on the right and 41% on the left.  Past Medical History  Diagnosis Date  . Arthritis     Family History  Problem Relation Age of Onset  . Cancer Mother   . Heart disease Mother   . Cancer Father   . Cancer Daughter   . Heart disease Brother     before age 17    SOCIAL HISTORY: Social History  Substance Use Topics  . Smoking status: Current Every Day Smoker -- 1.00 packs/day for 50 years    Types: Cigarettes  . Smokeless tobacco: Not on file  . Alcohol Use: No    No Known Allergies  Current Outpatient Prescriptions  Medication Sig Dispense Refill  . ALPRAZolam (XANAX) 0.5 MG tablet Take 0.25 mg by mouth 2 (two) times daily.    Marland Kitchen amLODipine (NORVASC) 5 MG tablet Take 5 mg by mouth daily.    . benazepril (LOTENSIN) 10 MG tablet Take 10 mg by mouth daily.    . Calcium-Phosphorus-Vitamin D (CALCIUM GUMMIES PO)  Take 1 tablet by mouth at bedtime.    . cholecalciferol (VITAMIN D) 1000 UNITS tablet Take 1,000 Units by mouth daily.    . citalopram (CELEXA) 20 MG tablet Take 20 mg by mouth daily.    . furosemide (LASIX) 20 MG tablet Take 1 tablet by mouth daily.  0  . HYDROcodone-acetaminophen (NORCO/VICODIN) 5-325 MG per tablet Take 1 tablet by mouth 2 (two) times daily.     . methocarbamol (ROBAXIN) 500 MG tablet Take 500 mg by mouth 2 (two) times daily.    . Multiple Vitamins-Minerals (MULTIVITAMIN ADULT PO) Take 1 tablet by mouth daily.    Marland Kitchen oxyCODONE-acetaminophen (ROXICET) 5-325 MG per tablet Take 1-2 tablets by mouth every 4 (four) hours as needed for severe pain. (Patient not taking: Reported on 04/28/2015) 30 tablet 0  . ranitidine (ZANTAC) 300 MG tablet Take 300 mg by mouth daily.    Marland Kitchen warfarin (COUMADIN) 2 MG tablet Take 2 mg by mouth daily. Reported on 12/08/2015     No current facility-administered medications for this visit.    REVIEW OF SYSTEMS:   denotes positive finding,  denotes negative finding Cardiac  Comments:  Chest pain or chest pressure: X   Shortness of breath upon exertion: X   Short of breath when lying flat: X   Irregular heart rhythm: X  Vascular    Pain in calf, thigh, or hip brought on by ambulation: X   Pain in feet at night that wakes you up from your sleep:  X   Blood clot in your veins: X   Leg swelling:  X       Pulmonary    Oxygen at home:    Productive cough:  X   Wheezing:  X       Neurologic    Sudden weakness in arms or legs:     Sudden numbness in arms or legs:     Sudden onset of difficulty speaking or slurred speech:    Temporary loss of vision in one eye:     Problems with dizziness:  X       Gastrointestinal    Blood in stool:     Vomited blood:         Genitourinary    Burning when urinating:     Blood in urine:        Psychiatric    Major depression:  X       Hematologic    Bleeding problems:    Problems with blood  clotting too easily: X       Skin    Rashes or ulcers:        Constitutional    Fever or chills: X     PHYSICAL EXAM: Filed Vitals:   12/08/15 0934 12/08/15 0940  BP: 152/76 156/76  Pulse: 79   Temp: 97.3 F (36.3 C)   TempSrc: Oral   Resp: 20   Height: 5' 5.5" (1.664 m)   Weight: 97 lb (43.999 kg)   SpO2: 89%     GENERAL: The patient is a well-nourished female, in no acute distress. The vital signs are documented above. CARDIAC: There is a regular rate and rhythm. She has a systolic ejection murmur. VASCULAR: I do not detect carotid bruits. Has palpable femoral pulses. Has severe bilateral lower extremity swelling and I cannot palpate pedal pulses. PULMONARY: There is good air exchange bilaterally without wheezing or rales. ABDOMEN: Soft and non-tender with normal pitched bowel sounds.  MUSCULOSKELETAL: There are no major deformities or cyanosis. NEUROLOGIC: No focal weakness or paresthesias are detected. SKIN: She has some mild cellulitis bilaterally with an open wound on her pretibial left leg that is approximate centimeter in diameter. PSYCHIATRIC: The patient has a normal affect.  DATA:   Lower Extremity Arterial Doppler Study: I have reviewed the Doppler study that was done in our office on 11/24/2015. This shows biphasic Doppler signals in the right foot with an ABI 100%. On the left side there are monophasic signals with an ABI of 87%. Toe pressure on the left is 94 mmHg.  MEDICAL ISSUES:  CHRONIC VENOUS INSUFFICIENCY WITH ULCERATION: This patient has severe bilateral lower extremity swelling. We have discussed the importance of intermittent leg elevation the proper positioning for this. Based on our Doppler study on 11/24/15, she had an ABI of 87% on the left with a toe pressure of 94 mmHg. She does have a known popliteal artery occlusion on the left, but a toe pressure of 94 mmHg would suggest that she has adequate circulation to heal the wounds on her left leg with  continued compression therapy and elevation.   In addition, based on her previous arteriogram, she would not be a candidate for endovascular approach to her popliteal artery occlusive disease. Thus she would likely require a bypass if these wounds do not heal.  She would be at very high risk for wound healing problems in the lower leg given the severe swelling. In addition she is high-risk for surgery given her multiple medical comorbidities area she has angina, and systolic murmur, and multiple medical issues.  I plan on seeing her back in 6 months with follow up ABIs. She knows to call sooner if she has problems.  HYPERTENSION: The patient's initial blood pressure today was elevated. We repeated this and this was still elevated. We have encouraged the patient to follow up with their primary care physician for management of their blood pressure.   Waverly Ferrariickson, Yolanda Martin [facility] [place] Beeper: [phone]

## 2015-12-09 ENCOUNTER — Encounter: Payer: Self-pay | Admitting: Surgery

## 2015-12-23 DIAGNOSIS — I872 Venous insufficiency (chronic) (peripheral): Secondary | ICD-10-CM | POA: Diagnosis not present

## 2015-12-23 DIAGNOSIS — I10 Essential (primary) hypertension: Secondary | ICD-10-CM | POA: Diagnosis not present

## 2015-12-23 DIAGNOSIS — L03116 Cellulitis of left lower limb: Secondary | ICD-10-CM | POA: Diagnosis not present

## 2015-12-23 DIAGNOSIS — F0280 Dementia in other diseases classified elsewhere without behavioral disturbance: Secondary | ICD-10-CM | POA: Diagnosis not present

## 2016-01-04 NOTE — Addendum Note (Signed)
Addended by: Melodye PedMANESS-HARRISON, CHANDA C on: 01/04/2016 02:46 PM   Modules accepted: Orders

## 2016-02-07 ENCOUNTER — Telehealth: Payer: Self-pay

## 2016-02-07 DIAGNOSIS — I70248 Atherosclerosis of native arteries of left leg with ulceration of other part of lower left leg: Secondary | ICD-10-CM

## 2016-02-07 NOTE — Telephone Encounter (Signed)
Phone call from daughter with request for an appt., due to worsening of left LE wound.  Reported the pt. [facility] to [facility] care of left leg, up until last winter.  Daughter reported she has been doing wound care about 3 x/ week.  Stated she contacted the Wound Center today, and was told, since the wound hasn't healed, she will need to contact the vascular surgeon.  Daughter stated she was told by the Wound Center that the pt. is at risk of losing her leg.  Daughter stated the pt's leg is draining a pus-type drainage at times.  Denied any fever or chills.  Reported she has been cleaning the pt's left leg with soap and water, and cleaning the wound with an antiseptic cleanser, and wrapping the leg with gauze.  Advised will discuss with Dr. Edilia Boickson, and will call back with appt. Information.

## 2016-02-08 NOTE — Telephone Encounter (Signed)
Sched lab 6/14 at 12 and MD 6/16 at 12:45. Lm on daughter's # to inform them of appts.

## 2016-02-08 NOTE — Telephone Encounter (Signed)
Chuck Hinthristopher S Dickson, MD  Phillips Odorarol S Sydnee Lamour, RN           I could see her Friday as I have added on an office. I would get ABI's and toe pressure.  Thanks  CD     Will request appointment scheduler to schedule appt. and notify pt's. daughter.

## 2016-02-09 ENCOUNTER — Ambulatory Visit (HOSPITAL_COMMUNITY)
Admission: RE | Admit: 2016-02-09 | Discharge: 2016-02-09 | Disposition: A | Discharge status: Home / Self Care | Payer: Medicare Other | Source: Ambulatory Visit | Attending: Vascular Surgery | Admitting: Vascular Surgery

## 2016-02-09 ENCOUNTER — Encounter: Payer: Self-pay | Admitting: Vascular Surgery

## 2016-02-09 DIAGNOSIS — I70248 Atherosclerosis of native arteries of left leg with ulceration of other part of lower left leg: Secondary | ICD-10-CM

## 2016-02-11 ENCOUNTER — Ambulatory Visit: Payer: Medicare Other | Admitting: Vascular Surgery

## 2016-02-21 ENCOUNTER — Encounter: Payer: Self-pay | Admitting: Vascular Surgery

## 2016-03-01 ENCOUNTER — Encounter: Payer: Self-pay | Admitting: Vascular Surgery

## 2016-03-01 ENCOUNTER — Ambulatory Visit (INDEPENDENT_AMBULATORY_CARE_PROVIDER_SITE_OTHER): Payer: Medicare Other | Admitting: Vascular Surgery

## 2016-03-01 VITALS — BP 116/72 | HR 80 | Temp 97.4°F | Resp 16 | Ht 65.5 in | Wt 97.0 lb

## 2016-03-01 DIAGNOSIS — I739 Peripheral vascular disease, unspecified: Secondary | ICD-10-CM

## 2016-03-01 DIAGNOSIS — I872 Venous insufficiency (chronic) (peripheral): Secondary | ICD-10-CM | POA: Diagnosis not present

## 2016-03-01 NOTE — Progress Notes (Signed)
Referring Physician: Alysia PennaHolwerda, Scott, MD  Patient name: Yolanda Martin MRN: [id number] DOB: May 08, 1934 Sex: female  CC: left > right lower extremity edema  HPI: Yolanda Martin is a 82 y.o. female appendectomy was referred with a nonhealing wound of the left leg and peripheral vascular disease. I have seen her in the past. She did have an arteriogram on 04/06/14 which showed mild diffuse disease of the infrarenal aorta and bilateral common iliac arteries but no focal stenosis. On the left side, which is the site of concern, there was a 4 cm total occlusion of the above-knee popliteal artery with reconstitution at the level of the patella. There were extensive collaterals. The below-knee popliteal artery was patent with three-vessel runoff on the left.    She reports no new ulcers, just edema and weeping.  Her daughter warps her left leg with guaze and an ace wrap daily to help with the swelling.  She sits and sleeps in her WC.  She does not lay flat much at all.   No change is past medical history since her last visit 12/08/2015.  Past Medical History  Diagnosis Date  . Arthritis    Past Surgical History  Procedure Laterality Date  . Abdominal hysterectomy    . Abdominal aortagram N/A 04/06/2014    Procedure: ABDOMINAL Ronny FlurryAORTAGRAM;  Surgeon: Chuck Hinthristopher S Dickson, MD;  [facility];  Service: Cardiovascular;  Laterality: N/A;    Family History  Problem Relation Age of Onset  . Cancer Mother   . Heart disease Mother   . Cancer Father   . Cancer Daughter   . Heart disease Brother     before age 80    SOCIAL HISTORY: Social History   Social History  . Marital Status: Married    Spouse Name: N/A  . Number of Children: N/A  . Years of Education: N/A   Occupational History  . Not on file.   Social History Main Topics  . Smoking status: Current Every Day Smoker -- 1.00 packs/day for 50 years    Types: Cigarettes  . Smokeless tobacco: Not on file  . Alcohol Use: No   . Drug Use: No  . Sexual Activity: Not on file   Other Topics Concern  . Not on file   Social History Narrative    No Known Allergies  Current Outpatient Prescriptions  Medication Sig Dispense Refill  . ALPRAZolam (XANAX) 0.5 MG tablet Take 0.25 mg by mouth 2 (two) times daily.    . benazepril (LOTENSIN) 10 MG tablet Take 10 mg by mouth daily.    . Calcium-Phosphorus-Vitamin D (CALCIUM GUMMIES PO) Take 1 tablet by mouth at bedtime.    . cholecalciferol (VITAMIN D) 1000 UNITS tablet Take 1,000 Units by mouth daily.    . furosemide (LASIX) 20 MG tablet Take 1 tablet by mouth daily.  0  . HYDROcodone-acetaminophen (NORCO/VICODIN) 5-325 MG per tablet Take 1 tablet by mouth 2 (two) times daily.     . mirtazapine (REMERON) 15 MG tablet     . Multiple Vitamins-Minerals (MULTIVITAMIN ADULT PO) Take 1 tablet by mouth daily.    . ranitidine (ZANTAC) 300 MG tablet Take 300 mg by mouth daily.    Marland Kitchen. amLODipine (NORVASC) 5 MG tablet Take 5 mg by mouth daily. Reported on 03/01/2016    . citalopram (CELEXA) 20 MG tablet Take 20 mg by mouth daily. Reported on 03/01/2016    . methocarbamol (ROBAXIN) 500 MG tablet Take 500 mg by  mouth 2 (two) times daily. Reported on 03/01/2016    . oxyCODONE-acetaminophen (ROXICET) 5-325 MG per tablet Take 1-2 tablets by mouth every 4 (four) hours as needed for severe pain. (Patient not taking: Reported on 04/28/2015) 30 tablet 0  . warfarin (COUMADIN) 2 MG tablet Take 2 mg by mouth daily. Reported on 03/01/2016     No current facility-administered medications for this visit.    ROS:   General:  No weight loss, Fever, chills  HEENT: No recent headaches, no nasal bleeding, no visual changes, no sore throat  Neurologic: No dizziness, blackouts, seizures. No recent symptoms of stroke or mini- stroke. No recent episodes of slurred speech, or temporary blindness.  Cardiac: No recent episodes of chest pain/pressure, no shortness of breath at rest.  No shortness of breath with  exertion.  Denies history of atrial fibrillation or irregular heartbeat  Vascular: No history of rest pain in feet.  No history of claudication.  No history of non-healing ulcer, No history of DVT   Pulmonary: No home oxygen, no productive cough, no hemoptysis,  No asthma or wheezing  Musculoskeletal:   Arthritis,  Low back pain,   Joint pain  Hematologic:No history of hypercoagulable state.  No history of easy bleeding.  No history of anemia  Gastrointestinal: No hematochezia or melena,  No gastroesophageal reflux, no trouble swallowing  Urinary:  chronic Kidney disease,  on HD -  MWF or  TTHS,  Burning with urination,  Frequent urination,  Difficulty urinating;   Skin: No rashes,  Psychological: No history of anxiety,  No history of depression   Physical Examination  Filed Vitals:   03/01/16 1405  BP: 116/72  Pulse: 80  Temp: 97.4 F (36.3 C)  TempSrc: Oral  Resp: 16  Height: 5' 5.5" (1.664 m)  Weight: 97 lb (43.999 kg)  SpO2: 95%    Body mass index is 15.89 kg/(m^2).  General:  Alert and oriented, no acute distress HEENT: Normal Neck: No bruit or JVD Pulmonary: Clear to auscultation bilaterally Cardiac: Regular Rate and Rhythm without murmur Abdomen: Soft, non-tender, non-distended, no mass, no scars Skin: No rash,sever left LE edema with dry skin and weeping.  No open wounds at this time  Extremity Pulses:  2+ radial, brachial, femoral, non palpable dorsalis pedis, posterior tibial pulses bilaterally Musculoskeletal: No deformity   Neurologic: Upper and lower extremity motor grossly intact and symmetric  DATA:  ABI 02/09/2016 right 0.92, left monophasic wave forms, non compressible artery   ASSESSMENT:   Occlusion left AK popliteal artery with three vessel run off Angio study 2015. Chronic venous insuffiency  PLAN:   We have discussed the importants of not sitting in a dependent position all day.  She needs to lay flat and place  a few pillows under her left leg for elevation to decrease the swelling and not decrease the arterial flow.   Daily ace wraps more compression at the distal end and less proximally for gradual compression.   F/U in 6 months for repeat ABI's B   Vernis Cabacungan MAUREEN PA-C [facility]  The patient was seen in conjunction with Dr. Edilia Bo  I have interviewed the patient and examined the patient. I agree with the findings by the PA. Will continue conservative treatment and she is not a great candidate for bypass given the risk for wound healing problems and her somewhat debilitated state. I'll see her back in 6  months. She is to call sooner if she has problems.  Cari Carawayhris Dickson, MD [phone]

## 2016-03-09 DIAGNOSIS — I872 Venous insufficiency (chronic) (peripheral): Secondary | ICD-10-CM | POA: Diagnosis not present

## 2016-03-09 DIAGNOSIS — F0280 Dementia in other diseases classified elsewhere without behavioral disturbance: Secondary | ICD-10-CM | POA: Diagnosis not present

## 2016-03-09 DIAGNOSIS — I10 Essential (primary) hypertension: Secondary | ICD-10-CM | POA: Diagnosis not present

## 2016-04-28 DIAGNOSIS — I10 Essential (primary) hypertension: Secondary | ICD-10-CM | POA: Diagnosis not present

## 2016-04-28 DIAGNOSIS — F0280 Dementia in other diseases classified elsewhere without behavioral disturbance: Secondary | ICD-10-CM | POA: Diagnosis not present

## 2016-04-28 DIAGNOSIS — I872 Venous insufficiency (chronic) (peripheral): Secondary | ICD-10-CM | POA: Diagnosis not present

## 2016-06-22 ENCOUNTER — Encounter: Payer: Self-pay | Admitting: Vascular Surgery

## 2016-06-28 ENCOUNTER — Ambulatory Visit: Payer: Medicare Other | Admitting: Vascular Surgery

## 2016-06-28 ENCOUNTER — Encounter (HOSPITAL_COMMUNITY): Payer: Medicare Other

## 2016-07-19 ENCOUNTER — Ambulatory Visit: Payer: Medicare Other | Admitting: Vascular Surgery

## 2016-07-19 ENCOUNTER — Encounter (HOSPITAL_COMMUNITY): Payer: Medicare Other

## 2016-08-24 ENCOUNTER — Encounter: Payer: Self-pay | Admitting: Vascular Surgery

## 2016-09-06 ENCOUNTER — Ambulatory Visit (HOSPITAL_COMMUNITY): Payer: Medicare Other

## 2016-09-06 ENCOUNTER — Ambulatory Visit: Payer: Medicare Other | Admitting: Vascular Surgery

## 2016-09-08 ENCOUNTER — Emergency Department (HOSPITAL_COMMUNITY)
Admission: EM | Admit: 2016-09-08 | Discharge: 2016-09-08 | Disposition: A | Discharge status: Home / Self Care | Payer: Medicare Other | Attending: Emergency Medicine | Admitting: Emergency Medicine

## 2016-09-08 ENCOUNTER — Encounter (HOSPITAL_COMMUNITY): Payer: Self-pay | Admitting: Emergency Medicine

## 2016-09-08 ENCOUNTER — Emergency Department (HOSPITAL_COMMUNITY): Payer: Medicare Other

## 2016-09-08 DIAGNOSIS — S299XXA Unspecified injury of thorax, initial encounter: Secondary | ICD-10-CM | POA: Insufficient documentation

## 2016-09-08 DIAGNOSIS — Y999 Unspecified external cause status: Secondary | ICD-10-CM | POA: Diagnosis not present

## 2016-09-08 DIAGNOSIS — S42001A Fracture of unspecified part of right clavicle, initial encounter for closed fracture: Secondary | ICD-10-CM | POA: Diagnosis not present

## 2016-09-08 DIAGNOSIS — R259 Unspecified abnormal involuntary movements: Secondary | ICD-10-CM | POA: Diagnosis not present

## 2016-09-08 DIAGNOSIS — Y9384 Activity, sleeping: Secondary | ICD-10-CM | POA: Diagnosis not present

## 2016-09-08 DIAGNOSIS — W050XXA Fall from non-moving wheelchair, initial encounter: Secondary | ICD-10-CM | POA: Insufficient documentation

## 2016-09-08 DIAGNOSIS — F17210 Nicotine dependence, cigarettes, uncomplicated: Secondary | ICD-10-CM | POA: Insufficient documentation

## 2016-09-08 DIAGNOSIS — Y929 Unspecified place or not applicable: Secondary | ICD-10-CM | POA: Diagnosis not present

## 2016-09-08 DIAGNOSIS — W19XXXA Unspecified fall, initial encounter: Secondary | ICD-10-CM

## 2016-09-08 DIAGNOSIS — I10 Essential (primary) hypertension: Secondary | ICD-10-CM | POA: Diagnosis not present

## 2016-09-08 DIAGNOSIS — Z7901 Long term (current) use of anticoagulants: Secondary | ICD-10-CM | POA: Insufficient documentation

## 2016-09-08 DIAGNOSIS — R030 Elevated blood-pressure reading, without diagnosis of hypertension: Secondary | ICD-10-CM | POA: Diagnosis not present

## 2016-09-08 DIAGNOSIS — F0390 Unspecified dementia without behavioral disturbance: Secondary | ICD-10-CM | POA: Diagnosis not present

## 2016-09-08 DIAGNOSIS — R079 Chest pain, unspecified: Secondary | ICD-10-CM | POA: Diagnosis not present

## 2016-09-08 DIAGNOSIS — S2241XA Multiple fractures of ribs, right side, initial encounter for closed fracture: Secondary | ICD-10-CM | POA: Diagnosis not present

## 2016-09-08 DIAGNOSIS — S42021A Displaced fracture of shaft of right clavicle, initial encounter for closed fracture: Secondary | ICD-10-CM | POA: Diagnosis not present

## 2016-09-08 DIAGNOSIS — Z79899 Other long term (current) drug therapy: Secondary | ICD-10-CM | POA: Insufficient documentation

## 2016-09-08 DIAGNOSIS — R2689 Other abnormalities of gait and mobility: Secondary | ICD-10-CM | POA: Diagnosis not present

## 2016-09-08 HISTORY — DX: Unspecified dementia, unspecified severity, without behavioral disturbance, psychotic disturbance, mood disturbance, and anxiety: F03.90

## 2016-09-08 MED ORDER — ACETAMINOPHEN 325 MG PO TABS
650.0000 mg | ORAL_TABLET | Freq: Once | ORAL | Status: AC
  Administered 2016-09-08: 650 mg via ORAL
  Filled 2016-09-08: qty 2

## 2016-09-08 NOTE — ED Notes (Signed)
Daughter at bedside and aware of diagnosis and instructions.

## 2016-09-08 NOTE — ED Notes (Signed)
RN called ortho tech for sling

## 2016-09-08 NOTE — ED Triage Notes (Signed)
Pt came with EMS with another pt due to patient having dementia and is unsafe to leave at home alone. Pt c/o R shoulder pain that started 4 days ago. Pt fell out of wheelchair due to falling asleep

## 2016-09-08 NOTE — ED Notes (Signed)
Pt moved to room 13 to place on bedpan

## 2016-09-08 NOTE — ED Provider Notes (Signed)
[facility]-EMERGENCY DEPT Provider Note   CSN: [id number] Arrival date & time: 09/08/16  0236   By signing my name below, I, Clovis Pu, attest that this documentation has been prepared under the direction and in the presence of Azalia Bilis, MD  Electronically Signed: Clovis Pu, ED Scribe. 09/08/16. 2:50 AM.   History   Chief Complaint Chief Complaint  Patient presents with  . Shoulder Pain    The history is provided by the patient. No language interpreter was used.   HPI Comments:  Yolanda Martin is a 83 y.o. female, with a hx of dementia, who presents to the Emergency Department complaining of persistent right shoulder pain s/p a fall which occurred around 1 week ago. Pt states she fell out of her wheelchair when she was asleep causing her to sustain her injury. Her pain is worse upon palpation. No alleviating factors noted. No other associated symptoms noted.   Past Medical History:  Diagnosis Date  . Arthritis   . Dementia     Patient Active Problem List   Diagnosis Date Noted  . Essential hypertension, benign 07/01/2014  . Ulcer of lower limb, unspecified 03/11/2014  . Atherosclerotic PVD with ulceration (HCC) 03/11/2014    Past Surgical History:  Procedure Laterality Date  . ABDOMINAL AORTAGRAM N/A 04/06/2014   Procedure: ABDOMINAL Ronny Flurry;  Surgeon: Chuck Hint, MD;  [facility];  Service: Cardiovascular;  Laterality: N/A;  . ABDOMINAL HYSTERECTOMY    . FRACTURE SURGERY     L hip, L wrist    OB History    No data available       Home Medications    Prior to Admission medications   Medication Sig Start Date End Date Taking? Authorizing Provider  ALPRAZolam Prudy Feeler) 0.5 MG tablet Take 0.25 mg by mouth 2 (two) times daily. 03/18/15   Historical Provider, MD  amLODipine (NORVASC) 5 MG tablet Take 5 mg by mouth daily. Reported on 03/01/2016 02/21/15   Historical Provider, MD  benazepril (LOTENSIN) 10 MG tablet Take 10 mg by mouth daily.  02/21/15   Historical Provider, MD  Calcium-Phosphorus-Vitamin D (CALCIUM GUMMIES PO) Take 1 tablet by mouth at bedtime.    Historical Provider, MD  cholecalciferol (VITAMIN D) 1000 UNITS tablet Take 1,000 Units by mouth daily.    Historical Provider, MD  citalopram (CELEXA) 20 MG tablet Take 20 mg by mouth daily. Reported on 03/01/2016 02/19/15   Historical Provider, MD  furosemide (LASIX) 20 MG tablet Take 1 tablet by mouth daily. 05/10/14   Historical Provider, MD  HYDROcodone-acetaminophen (NORCO/VICODIN) 5-325 MG per tablet Take 1 tablet by mouth 2 (two) times daily.  03/26/15   Historical Provider, MD  methocarbamol (ROBAXIN) 500 MG tablet Take 500 mg by mouth 2 (two) times daily. Reported on 03/01/2016    Historical Provider, MD  mirtazapine (REMERON) 15 MG tablet  02/10/16   Historical Provider, MD  Multiple Vitamins-Minerals (MULTIVITAMIN ADULT PO) Take 1 tablet by mouth daily.    Historical Provider, MD  oxyCODONE-acetaminophen (ROXICET) 5-325 MG per tablet Take 1-2 tablets by mouth every 4 (four) hours as needed for severe pain. Patient not taking: Reported on 04/28/2015 03/11/14   Chuck Hint, MD  ranitidine (ZANTAC) 300 MG tablet Take 300 mg by mouth daily. 04/16/15   Historical Provider, MD  warfarin (COUMADIN) 2 MG tablet Take 2 mg by mouth daily. Reported on 03/01/2016 04/16/15   Historical Provider, MD    Family History Family History  Problem Relation Age  of Onset  . Cancer Mother   . Heart disease Mother   . Cancer Father   . Cancer Daughter   . Heart disease Brother     before age 81    Social History Social History  Substance Use Topics  . Smoking status: Current Every Day Smoker    Packs/day: 1.00    Years: 50.00    Types: Cigarettes  . Smokeless tobacco: Never Used  . Alcohol use No     Allergies   Patient has no known allergies.   Review of Systems Review of Systems 10 systems reviewed and all are negative for acute change except as noted in the  HPI.  Physical Exam Updated Vital Signs Ht 5\' 5"  (1.651 m)   Wt 105 lb (47.6 kg)   BMI 17.47 kg/m   Physical Exam  Constitutional: She is oriented to person, place, and time. She appears well-developed and well-nourished.  HENT:  Head: Normocephalic.  Eyes: EOM are normal.  Neck: Normal range of motion.  Pulmonary/Chest: Effort normal.  Abdominal: She exhibits no distension.  Musculoskeletal: Normal range of motion. She exhibits edema.  Bruising over right anterior chest and right breast. Bruising and swelling over right clavicle. FROM of right shoulder   Neurological: She is alert and oriented to person, place, and time.  Psychiatric: She has a normal mood and affect.  Nursing note and vitals reviewed.   ED Treatments / Results  DIAGNOSTIC STUDIES:     COORDINATION OF CARE:  2:46 AM Discussed treatment plan with pt at bedside and pt agreed to plan.  Labs (all labs ordered are listed, but only abnormal results are displayed) Labs Reviewed - No data to display  EKG  EKG Interpretation None       Radiology No results found.  Procedures Procedures (including critical care time)  Medications Ordered in ED Medications  acetaminophen (TYLENOL) tablet 650 mg (650 mg Oral Given 09/08/16 0259)     Initial Impression / Assessment and Plan / ED Course  I have reviewed the triage vital signs and the nursing notes.  Pertinent labs & imaging results that were available during my care of the patient were reviewed by me and considered in my medical decision making (see chart for details).  Clinical Course   Patient with right clavicle fracture right-sided rib fractures.  No underlying pulmonary contusions.  I'm has the give this 83 year old female with dementia and what sounds like some frequent falls narcotic pain medication.  Recommended Tylenol at home for pain.  Patient will require orthopedic follow-up for her right clavicle fracture.  Primary care follow-up.   Patient placed in a sling  Final Clinical Impressions(s) / ED Diagnoses   Final diagnoses:  Fall    New Prescriptions Current Discharge Medication List     I personally performed the services described in this documentation, which was scribed in my presence. The recorded information has been reviewed and is accurate.        Azalia BilisKevin Toshiyuki Fredell, MD 09/08/16 [phone]

## 2016-09-08 NOTE — Care Management Note (Signed)
Case Management Note  Patient Details  Name: Georgina QuintBetty R Berwanger MRN: [id number] Date of Birth: 05/20/1934  Subjective/Objective:                  From home with spouse. /82 y.o. female, with a hx of dementia, who presents to the Emergency Department complaining of persistent right shoulder pain s/p a fall which occurred around 1 week ago. Pt states she fell out of her wheelchair when she was asleep causing her to sustain her injury.   Action/Plan: Follow for disposition needs.   Expected Discharge Date:  09/08/16               Expected Discharge Plan:  Home w Home Health Services  In-House Referral:  NA  Discharge planning Services  CM Consult  Post Acute Care Choice:  Home Health Choice offered to:  Spouse, Adult Children  DME Arranged:  N/A DME Agency:  NA  HH Arranged:  RN, Social Work [employer] Agency:  [facility]  Status of Service:  Completed, signed off  If discussed at [employer] Meetings, dates discussed:    Additional Comments: Willem Klingensmith J. Lucretia RoersWood, RN, BSN, [employer] [phone] Spoke with pt and daughter at bedside regarding discharge planning for [facility]. Offered pt and daughter list of home health agencies to choose from.  [facility]. Angela Adamrew Wilkie [facility] notified.  No DME needs identified at this time.  Oletta CohnWood, Freyja Govea, RN 09/08/2016, 9:22 AM

## 2016-09-08 NOTE — ED Notes (Signed)
Contact made with daughter who states she is at the patients home awaiting EMS. Daughter states she has d/c instructions and does u nderstand them.

## 2016-09-08 NOTE — ED Notes (Signed)
[facility] called for transport.  
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
## 2016-09-12 DIAGNOSIS — I10 Essential (primary) hypertension: Secondary | ICD-10-CM | POA: Diagnosis not present

## 2016-09-12 DIAGNOSIS — Z79899 Other long term (current) drug therapy: Secondary | ICD-10-CM | POA: Diagnosis not present

## 2016-09-12 DIAGNOSIS — F0280 Dementia in other diseases classified elsewhere without behavioral disturbance: Secondary | ICD-10-CM | POA: Diagnosis not present

## 2016-09-12 DIAGNOSIS — E440 Moderate protein-calorie malnutrition: Secondary | ICD-10-CM | POA: Diagnosis not present

## 2016-09-18 ENCOUNTER — Emergency Department (HOSPITAL_COMMUNITY): Payer: Medicare Other

## 2016-09-18 ENCOUNTER — Encounter (HOSPITAL_COMMUNITY): Payer: Self-pay | Admitting: Emergency Medicine

## 2016-09-18 ENCOUNTER — Inpatient Hospital Stay (HOSPITAL_COMMUNITY)
Admission: EM | Admit: 2016-09-18 | Discharge: 2016-09-22 | DRG: 917 | Disposition: A | Discharge status: Skilled Nursing Facility | Payer: Medicare Other | Attending: Internal Medicine | Admitting: Internal Medicine

## 2016-09-18 DIAGNOSIS — R4182 Altered mental status, unspecified: Secondary | ICD-10-CM | POA: Diagnosis not present

## 2016-09-18 DIAGNOSIS — W19XXXA Unspecified fall, initial encounter: Secondary | ICD-10-CM | POA: Diagnosis present

## 2016-09-18 DIAGNOSIS — E860 Dehydration: Secondary | ICD-10-CM | POA: Diagnosis not present

## 2016-09-18 DIAGNOSIS — I6789 Other cerebrovascular disease: Secondary | ICD-10-CM | POA: Diagnosis not present

## 2016-09-18 DIAGNOSIS — F4489 Other dissociative and conversion disorders: Secondary | ICD-10-CM | POA: Diagnosis not present

## 2016-09-18 DIAGNOSIS — Y92019 Unspecified place in single-family (private) house as the place of occurrence of the external cause: Secondary | ICD-10-CM

## 2016-09-18 DIAGNOSIS — N390 Urinary tract infection, site not specified: Secondary | ICD-10-CM | POA: Diagnosis present

## 2016-09-18 DIAGNOSIS — S2241XA Multiple fractures of ribs, right side, initial encounter for closed fracture: Secondary | ICD-10-CM | POA: Diagnosis not present

## 2016-09-18 DIAGNOSIS — Z8249 Family history of ischemic heart disease and other diseases of the circulatory system: Secondary | ICD-10-CM

## 2016-09-18 DIAGNOSIS — E876 Hypokalemia: Secondary | ICD-10-CM | POA: Diagnosis present

## 2016-09-18 DIAGNOSIS — Z90710 Acquired absence of both cervix and uterus: Secondary | ICD-10-CM

## 2016-09-18 DIAGNOSIS — Z9889 Other specified postprocedural states: Secondary | ICD-10-CM

## 2016-09-18 DIAGNOSIS — Z79899 Other long term (current) drug therapy: Secondary | ICD-10-CM

## 2016-09-18 DIAGNOSIS — Z23 Encounter for immunization: Secondary | ICD-10-CM

## 2016-09-18 DIAGNOSIS — I480 Paroxysmal atrial fibrillation: Secondary | ICD-10-CM | POA: Diagnosis not present

## 2016-09-18 DIAGNOSIS — Z809 Family history of malignant neoplasm, unspecified: Secondary | ICD-10-CM

## 2016-09-18 DIAGNOSIS — J449 Chronic obstructive pulmonary disease, unspecified: Secondary | ICD-10-CM | POA: Diagnosis not present

## 2016-09-18 DIAGNOSIS — I739 Peripheral vascular disease, unspecified: Secondary | ICD-10-CM | POA: Diagnosis present

## 2016-09-18 DIAGNOSIS — S42001A Fracture of unspecified part of right clavicle, initial encounter for closed fracture: Secondary | ICD-10-CM | POA: Diagnosis present

## 2016-09-18 DIAGNOSIS — Z515 Encounter for palliative care: Secondary | ICD-10-CM

## 2016-09-18 DIAGNOSIS — I4891 Unspecified atrial fibrillation: Secondary | ICD-10-CM

## 2016-09-18 DIAGNOSIS — Z9181 History of falling: Secondary | ICD-10-CM

## 2016-09-18 DIAGNOSIS — R627 Adult failure to thrive: Secondary | ICD-10-CM | POA: Diagnosis not present

## 2016-09-18 DIAGNOSIS — Z66 Do not resuscitate: Secondary | ICD-10-CM | POA: Diagnosis present

## 2016-09-18 DIAGNOSIS — T5891XA Toxic effect of carbon monoxide from unspecified source, accidental (unintentional), initial encounter: Principal | ICD-10-CM | POA: Diagnosis present

## 2016-09-18 DIAGNOSIS — G92 Toxic encephalopathy: Secondary | ICD-10-CM | POA: Diagnosis present

## 2016-09-18 DIAGNOSIS — M6281 Muscle weakness (generalized): Secondary | ICD-10-CM

## 2016-09-18 DIAGNOSIS — Z7189 Other specified counseling: Secondary | ICD-10-CM

## 2016-09-18 DIAGNOSIS — Z7901 Long term (current) use of anticoagulants: Secondary | ICD-10-CM

## 2016-09-18 DIAGNOSIS — R404 Transient alteration of awareness: Secondary | ICD-10-CM | POA: Diagnosis not present

## 2016-09-18 DIAGNOSIS — S42009A Fracture of unspecified part of unspecified clavicle, initial encounter for closed fracture: Secondary | ICD-10-CM | POA: Diagnosis present

## 2016-09-18 DIAGNOSIS — F17210 Nicotine dependence, cigarettes, uncomplicated: Secondary | ICD-10-CM | POA: Diagnosis present

## 2016-09-18 DIAGNOSIS — F0390 Unspecified dementia without behavioral disturbance: Secondary | ICD-10-CM | POA: Diagnosis present

## 2016-09-18 DIAGNOSIS — I872 Venous insufficiency (chronic) (peripheral): Secondary | ICD-10-CM | POA: Diagnosis present

## 2016-09-18 DIAGNOSIS — Y92009 Unspecified place in unspecified non-institutional (private) residence as the place of occurrence of the external cause: Secondary | ICD-10-CM

## 2016-09-18 HISTORY — DX: Venous insufficiency (chronic) (peripheral): I87.2

## 2016-09-18 HISTORY — DX: Non-pressure chronic ulcer of unspecified part of unspecified lower leg with unspecified severity: L97.909

## 2016-09-18 HISTORY — DX: Non-pressure chronic ulcer of skin of other sites with unspecified severity: L98.499

## 2016-09-18 HISTORY — DX: Unspecified atherosclerosis of native arteries of extremities, unspecified extremity: I70.209

## 2016-09-18 HISTORY — DX: Tobacco use: Z72.0

## 2016-09-18 LAB — CBC WITH DIFFERENTIAL/PLATELET
Basophils Absolute: 0 10*3/uL (ref 0.0–0.1)
Basophils Relative: 0 %
Eosinophils Absolute: 0 10*3/uL (ref 0.0–0.7)
Eosinophils Relative: 0 %
HEMATOCRIT: 35.8 % — AB (ref 36.0–46.0)
HEMOGLOBIN: 12.1 g/dL (ref 12.0–15.0)
LYMPHS ABS: 0.9 10*3/uL (ref 0.7–4.0)
LYMPHS PCT: 10 %
MCH: 28.7 pg (ref 26.0–34.0)
MCHC: 33.8 g/dL (ref 30.0–36.0)
MCV: 85 fL (ref 78.0–100.0)
MONO ABS: 0.3 10*3/uL (ref 0.1–1.0)
MONOS PCT: 4 %
NEUTROS ABS: 7.9 10*3/uL — AB (ref 1.7–7.7)
NEUTROS PCT: 86 %
Platelets: 288 10*3/uL (ref 150–400)
RBC: 4.21 MIL/uL (ref 3.87–5.11)
RDW: 14.2 % (ref 11.5–15.5)
WBC: 9.1 10*3/uL (ref 4.0–10.5)

## 2016-09-18 LAB — MAGNESIUM: MAGNESIUM: 1.7 mg/dL (ref 1.7–2.4)

## 2016-09-18 LAB — LACTIC ACID, PLASMA
LACTIC ACID, VENOUS: 0.8 mmol/L (ref 0.5–1.9)
Lactic Acid, Venous: 1.3 mmol/L (ref 0.5–1.9)

## 2016-09-18 LAB — URINALYSIS, ROUTINE W REFLEX MICROSCOPIC
Bilirubin Urine: NEGATIVE
Glucose, UA: NEGATIVE mg/dL
KETONES UR: NEGATIVE mg/dL
Nitrite: NEGATIVE
PH: 5 (ref 5.0–8.0)
Protein, ur: NEGATIVE mg/dL
SPECIFIC GRAVITY, URINE: 1.011 (ref 1.005–1.030)

## 2016-09-18 LAB — COOXEMETRY PANEL
Carboxyhemoglobin: 2.2 % — ABNORMAL HIGH (ref 0.5–1.5)
METHEMOGLOBIN: 0.4 % (ref 0.0–1.5)
O2 Saturation: 96.4 %
TOTAL HEMOGLOBIN: 11.6 g/dL — AB (ref 12.0–16.0)
TOTAL OXYGEN CONTENT: 15.5 mL/dL (ref 15.0–23.0)

## 2016-09-18 LAB — COMPREHENSIVE METABOLIC PANEL
ALT: 9 U/L — AB (ref 14–54)
AST: 22 U/L (ref 15–41)
Albumin: 3.1 g/dL — ABNORMAL LOW (ref 3.5–5.0)
Alkaline Phosphatase: 107 U/L (ref 38–126)
Anion gap: 13 (ref 5–15)
BILIRUBIN TOTAL: 1.2 mg/dL (ref 0.3–1.2)
BUN: 15 mg/dL (ref 6–20)
CHLORIDE: 101 mmol/L (ref 101–111)
CO2: 25 mmol/L (ref 22–32)
CREATININE: 0.73 mg/dL (ref 0.44–1.00)
Calcium: 8.5 mg/dL — ABNORMAL LOW (ref 8.9–10.3)
Glucose, Bld: 82 mg/dL (ref 65–99)
Potassium: 2.8 mmol/L — ABNORMAL LOW (ref 3.5–5.1)
Sodium: 139 mmol/L (ref 135–145)
Total Protein: 6.3 g/dL — ABNORMAL LOW (ref 6.5–8.1)

## 2016-09-18 LAB — RAPID URINE DRUG SCREEN, HOSP PERFORMED
Amphetamines: NOT DETECTED
BARBITURATES: NOT DETECTED
Benzodiazepines: POSITIVE — AB
COCAINE: NOT DETECTED
OPIATES: NOT DETECTED
TETRAHYDROCANNABINOL: NOT DETECTED

## 2016-09-18 LAB — TROPONIN I: Troponin I: <0.03 ng/mL (ref <0.03)

## 2016-09-18 LAB — PROTIME-INR
INR: 0.96
PROTHROMBIN TIME: 12.8 s (ref 11.4–15.2)

## 2016-09-18 LAB — ACETAMINOPHEN LEVEL

## 2016-09-18 LAB — ETHANOL

## 2016-09-18 LAB — CK: Total CK: 238 U/L — ABNORMAL HIGH (ref 38–234)

## 2016-09-18 LAB — SALICYLATE LEVEL: Salicylate Lvl: <7 mg/dL (ref 2.8–30.0)

## 2016-09-18 MED ORDER — CEFTRIAXONE SODIUM 1 G IJ SOLR: 1.0000 g | Freq: Once | INTRAMUSCULAR | Status: DC

## 2016-09-18 MED ORDER — LORAZEPAM 2 MG/ML IJ SOLN
0.5000 mg | Freq: Once | INTRAMUSCULAR | Status: AC
Start: 2016-09-18 — End: 2016-09-18
  Administered 2016-09-18: 0.5 mg via INTRAVENOUS
  Filled 2016-09-18: qty 1

## 2016-09-18 MED ORDER — AMLODIPINE BESYLATE 5 MG PO TABS
5.0000 mg | ORAL_TABLET | Freq: Every day | ORAL | Status: DC
  Administered 2016-09-18 – 2016-09-22 (×4): 5 mg via ORAL
  Filled 2016-09-18 (×5): qty 1

## 2016-09-18 MED ORDER — SODIUM CHLORIDE 0.9 % IV SOLN: INTRAVENOUS | Status: AC

## 2016-09-18 MED ORDER — TRAZODONE HCL 50 MG PO TABS
25.0000 mg | ORAL_TABLET | Freq: Every day | ORAL | Status: DC
  Administered 2016-09-18 – 2016-09-19 (×2): 25 mg via ORAL
  Filled 2016-09-18 (×2): qty 1

## 2016-09-18 MED ORDER — ONDANSETRON HCL 4 MG/2ML IJ SOLN
4.0000 mg | Freq: Four times a day (QID) | INTRAMUSCULAR | Status: DC | PRN
  Administered 2016-09-20: 4 mg via INTRAVENOUS
  Filled 2016-09-18: qty 2

## 2016-09-18 MED ORDER — DEXTROSE 5 % IV SOLN
1.0000 g | Freq: Once | INTRAVENOUS | Status: AC
  Administered 2016-09-18: 1 g via INTRAVENOUS
  Filled 2016-09-18: qty 10

## 2016-09-18 MED ORDER — POTASSIUM CHLORIDE 10 MEQ/100ML IV SOLN
10.0000 meq | INTRAVENOUS | Status: AC
  Administered 2016-09-18 (×3): 10 meq via INTRAVENOUS
  Filled 2016-09-18 (×3): qty 100

## 2016-09-18 MED ORDER — ENOXAPARIN SODIUM 40 MG/0.4ML ~~LOC~~ SOLN
40.0000 mg | SUBCUTANEOUS | Status: DC
  Administered 2016-09-18 – 2016-09-21 (×4): 40 mg via SUBCUTANEOUS
  Filled 2016-09-18 (×4): qty 0.4

## 2016-09-18 MED ORDER — INFLUENZA VAC SPLIT QUAD 0.5 ML IM SUSY: 0.5000 mL | PREFILLED_SYRINGE | INTRAMUSCULAR | Status: DC

## 2016-09-18 MED ORDER — ACETAMINOPHEN 650 MG RE SUPP: 650.0000 mg | Freq: Four times a day (QID) | RECTAL | Status: DC | PRN

## 2016-09-18 MED ORDER — MIRTAZAPINE 15 MG PO TABS
7.5000 mg | ORAL_TABLET | Freq: Every day | ORAL | Status: DC
  Administered 2016-09-18 – 2016-09-19 (×2): 7.5 mg via ORAL
  Filled 2016-09-18 (×2): qty 1

## 2016-09-18 MED ORDER — ENSURE ENLIVE PO LIQD
237.0000 mL | Freq: Two times a day (BID) | ORAL | Status: DC
  Administered 2016-09-20 – 2016-09-21 (×4): 237 mL via ORAL

## 2016-09-18 MED ORDER — DEXTROSE 5 % IV SOLN
1.0000 g | INTRAVENOUS | Status: DC
  Administered 2016-09-19 – 2016-09-20 (×2): 1 g via INTRAVENOUS
  Filled 2016-09-18 (×3): qty 10

## 2016-09-18 MED ORDER — HYDROCODONE-ACETAMINOPHEN 5-325 MG PO TABS
1.0000 | ORAL_TABLET | Freq: Four times a day (QID) | ORAL | Status: DC | PRN
  Administered 2016-09-18 – 2016-09-20 (×2): 1 via ORAL
  Filled 2016-09-18 (×2): qty 1

## 2016-09-18 MED ORDER — DONEPEZIL HCL 5 MG PO TABS
10.0000 mg | ORAL_TABLET | Freq: Every day | ORAL | Status: DC
  Administered 2016-09-18 – 2016-09-21 (×4): 10 mg via ORAL
  Filled 2016-09-18 (×4): qty 2

## 2016-09-18 MED ORDER — SODIUM CHLORIDE 0.9 % IV SOLN
INTRAVENOUS | Status: DC
  Administered 2016-09-18: 15:00:00 via INTRAVENOUS

## 2016-09-18 MED ORDER — SODIUM CHLORIDE 0.9 % IV BOLUS (SEPSIS)
500.0000 mL | Freq: Once | INTRAVENOUS | Status: AC
  Administered 2016-09-18: 500 mL via INTRAVENOUS

## 2016-09-18 MED ORDER — BENAZEPRIL HCL 10 MG PO TABS
10.0000 mg | ORAL_TABLET | Freq: Every day | ORAL | Status: DC
  Administered 2016-09-18 – 2016-09-22 (×4): 10 mg via ORAL
  Filled 2016-09-18 (×5): qty 1

## 2016-09-18 MED ORDER — ACETAMINOPHEN 325 MG PO TABS
650.0000 mg | ORAL_TABLET | Freq: Four times a day (QID) | ORAL | Status: DC | PRN
  Administered 2016-09-18 – 2016-09-21 (×4): 650 mg via ORAL
  Filled 2016-09-18 (×6): qty 2

## 2016-09-18 MED ORDER — POTASSIUM CHLORIDE CRYS ER 20 MEQ PO TBCR
40.0000 meq | EXTENDED_RELEASE_TABLET | Freq: Once | ORAL | Status: AC
  Administered 2016-09-18: 40 meq via ORAL
  Filled 2016-09-18: qty 2

## 2016-09-18 MED ORDER — ACETAMINOPHEN 500 MG PO TABS
1000.0000 mg | ORAL_TABLET | Freq: Once | ORAL | Status: AC
  Administered 2016-09-18: 1000 mg via ORAL
  Filled 2016-09-18: qty 2

## 2016-09-18 MED ORDER — ONDANSETRON HCL 4 MG PO TABS: 4.0000 mg | ORAL_TABLET | Freq: Four times a day (QID) | ORAL | Status: DC | PRN

## 2016-09-18 MED ORDER — CITALOPRAM HYDROBROMIDE 20 MG PO TABS
20.0000 mg | ORAL_TABLET | Freq: Every day | ORAL | Status: DC
  Administered 2016-09-18 – 2016-09-20 (×2): 20 mg via ORAL
  Filled 2016-09-18 (×2): qty 1

## 2016-09-18 MED ORDER — ALPRAZOLAM 0.5 MG PO TABS
0.5000 mg | ORAL_TABLET | Freq: Two times a day (BID) | ORAL | Status: DC | PRN
  Administered 2016-09-18: 0.5 mg via ORAL
  Filled 2016-09-18: qty 1

## 2016-09-18 NOTE — ED Notes (Signed)
Lab at bedside
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
## 2016-09-18 NOTE — ED Notes (Signed)
Pt's daughter arrived to hospital.  States she has not seen or spoken with her mother since last Tuesday.

## 2016-09-18 NOTE — ED Notes (Signed)
Pt's daughter, Council MechanicLinda McMurray [phone]

## 2016-09-18 NOTE — ED Notes (Signed)
Spoke with Charlynne Panderara from case management who will follow up on this case.
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
## 2016-09-18 NOTE — Clinical Social Work Note (Signed)
[facility], pt's [place] is [place]. CSW spoke with EDP who reports pt will be admitted. CSW to follow up in AM to confirm d/c plan.   Derenda FennelKara Simren Popson, LCSW [phone]

## 2016-09-18 NOTE — Progress Notes (Signed)
Pharmacy Antibiotic Note  Yolanda Martin is a 83 y.o. female admitted on 09/18/2016 with altered mental status and potential UTI.  Pharmacy has been consulted for Rocephin dosing.  Plan: Rocephin 1gm IV every 24 hours. Monitor labs, micro and vitals.   Height: 5\' 5"  (165.1 cm) Weight: 105 lb (47.6 kg) IBW/kg (Calculated) : 57  Temp (24hrs), Avg:97.4 F (36.3 C), Min:97.2 F (36.2 C), Max:97.6 F (36.4 C)   Recent Labs Lab 09/18/16 1326 09/18/16 1633  WBC 9.1  --   CREATININE 0.73  --   LATICACIDVEN 0.8 1.3    Estimated Creatinine Clearance: 42.1 mL/min (by C-G formula based on SCr of 0.73 mg/dL).    No Known Allergies  Antimicrobials this admission: Rocephin 1/22 >>   Dose adjustments this admission: n/a   Microbiology results: 1/22 UCx: pending   Thank you for allowing pharmacy to be a part of this patient's care.  Yolanda Martin, Yolanda Martin 09/18/2016 7:08 PM

## 2016-09-18 NOTE — Clinical Social Work Note (Signed)
CSW received information from GracetonAmanda, ED RN and has left voicemail for DSS requesting return call.  Derenda FennelKara Carleigh Buccieri, LCSW [phone]

## 2016-09-19 ENCOUNTER — Encounter (HOSPITAL_COMMUNITY): Payer: Self-pay | Admitting: Primary Care

## 2016-09-19 DIAGNOSIS — R400 Somnolence: Secondary | ICD-10-CM | POA: Diagnosis not present

## 2016-09-19 DIAGNOSIS — W19XXXA Unspecified fall, initial encounter: Secondary | ICD-10-CM | POA: Diagnosis present

## 2016-09-19 DIAGNOSIS — S2241XA Multiple fractures of ribs, right side, initial encounter for closed fracture: Secondary | ICD-10-CM | POA: Diagnosis present

## 2016-09-19 DIAGNOSIS — M6281 Muscle weakness (generalized): Secondary | ICD-10-CM | POA: Diagnosis not present

## 2016-09-19 DIAGNOSIS — K219 Gastro-esophageal reflux disease without esophagitis: Secondary | ICD-10-CM | POA: Diagnosis not present

## 2016-09-19 DIAGNOSIS — R4182 Altered mental status, unspecified: Secondary | ICD-10-CM

## 2016-09-19 DIAGNOSIS — Z7401 Bed confinement status: Secondary | ICD-10-CM | POA: Diagnosis not present

## 2016-09-19 DIAGNOSIS — J449 Chronic obstructive pulmonary disease, unspecified: Secondary | ICD-10-CM | POA: Diagnosis not present

## 2016-09-19 DIAGNOSIS — I10 Essential (primary) hypertension: Secondary | ICD-10-CM | POA: Diagnosis not present

## 2016-09-19 DIAGNOSIS — Z809 Family history of malignant neoplasm, unspecified: Secondary | ICD-10-CM | POA: Diagnosis not present

## 2016-09-19 DIAGNOSIS — F17210 Nicotine dependence, cigarettes, uncomplicated: Secondary | ICD-10-CM | POA: Diagnosis present

## 2016-09-19 DIAGNOSIS — I739 Peripheral vascular disease, unspecified: Secondary | ICD-10-CM | POA: Diagnosis present

## 2016-09-19 DIAGNOSIS — I480 Paroxysmal atrial fibrillation: Secondary | ICD-10-CM | POA: Diagnosis present

## 2016-09-19 DIAGNOSIS — Z9181 History of falling: Secondary | ICD-10-CM | POA: Diagnosis not present

## 2016-09-19 DIAGNOSIS — Z1612 Extended spectrum beta lactamase (ESBL) resistance: Secondary | ICD-10-CM | POA: Diagnosis not present

## 2016-09-19 DIAGNOSIS — G92 Toxic encephalopathy: Secondary | ICD-10-CM | POA: Diagnosis present

## 2016-09-19 DIAGNOSIS — Z66 Do not resuscitate: Secondary | ICD-10-CM | POA: Diagnosis present

## 2016-09-19 DIAGNOSIS — Z515 Encounter for palliative care: Secondary | ICD-10-CM

## 2016-09-19 DIAGNOSIS — E860 Dehydration: Secondary | ICD-10-CM | POA: Diagnosis present

## 2016-09-19 DIAGNOSIS — Z8249 Family history of ischemic heart disease and other diseases of the circulatory system: Secondary | ICD-10-CM | POA: Diagnosis not present

## 2016-09-19 DIAGNOSIS — S42001A Fracture of unspecified part of right clavicle, initial encounter for closed fracture: Secondary | ICD-10-CM | POA: Diagnosis present

## 2016-09-19 DIAGNOSIS — R41841 Cognitive communication deficit: Secondary | ICD-10-CM | POA: Diagnosis not present

## 2016-09-19 DIAGNOSIS — Z79899 Other long term (current) drug therapy: Secondary | ICD-10-CM | POA: Diagnosis not present

## 2016-09-19 DIAGNOSIS — S42001D Fracture of unspecified part of right clavicle, subsequent encounter for fracture with routine healing: Secondary | ICD-10-CM | POA: Diagnosis not present

## 2016-09-19 DIAGNOSIS — E876 Hypokalemia: Secondary | ICD-10-CM

## 2016-09-19 DIAGNOSIS — R627 Adult failure to thrive: Secondary | ICD-10-CM | POA: Diagnosis present

## 2016-09-19 DIAGNOSIS — Y92009 Unspecified place in unspecified non-institutional (private) residence as the place of occurrence of the external cause: Secondary | ICD-10-CM | POA: Diagnosis not present

## 2016-09-19 DIAGNOSIS — Z9889 Other specified postprocedural states: Secondary | ICD-10-CM | POA: Diagnosis not present

## 2016-09-19 DIAGNOSIS — F419 Anxiety disorder, unspecified: Secondary | ICD-10-CM | POA: Diagnosis not present

## 2016-09-19 DIAGNOSIS — R1312 Dysphagia, oropharyngeal phase: Secondary | ICD-10-CM | POA: Diagnosis not present

## 2016-09-19 DIAGNOSIS — Z7901 Long term (current) use of anticoagulants: Secondary | ICD-10-CM | POA: Diagnosis not present

## 2016-09-19 DIAGNOSIS — N390 Urinary tract infection, site not specified: Secondary | ICD-10-CM | POA: Diagnosis not present

## 2016-09-19 DIAGNOSIS — Z23 Encounter for immunization: Secondary | ICD-10-CM | POA: Diagnosis not present

## 2016-09-19 DIAGNOSIS — F329 Major depressive disorder, single episode, unspecified: Secondary | ICD-10-CM | POA: Diagnosis not present

## 2016-09-19 DIAGNOSIS — Z7189 Other specified counseling: Secondary | ICD-10-CM

## 2016-09-19 DIAGNOSIS — Z90710 Acquired absence of both cervix and uterus: Secondary | ICD-10-CM | POA: Diagnosis not present

## 2016-09-19 DIAGNOSIS — T5891XA Toxic effect of carbon monoxide from unspecified source, accidental (unintentional), initial encounter: Secondary | ICD-10-CM | POA: Diagnosis present

## 2016-09-19 DIAGNOSIS — L97909 Non-pressure chronic ulcer of unspecified part of unspecified lower leg with unspecified severity: Secondary | ICD-10-CM | POA: Diagnosis not present

## 2016-09-19 DIAGNOSIS — R279 Unspecified lack of coordination: Secondary | ICD-10-CM | POA: Diagnosis not present

## 2016-09-19 DIAGNOSIS — I872 Venous insufficiency (chronic) (peripheral): Secondary | ICD-10-CM | POA: Diagnosis present

## 2016-09-19 DIAGNOSIS — M129 Arthropathy, unspecified: Secondary | ICD-10-CM | POA: Diagnosis not present

## 2016-09-19 DIAGNOSIS — Y92019 Unspecified place in single-family (private) house as the place of occurrence of the external cause: Secondary | ICD-10-CM | POA: Diagnosis not present

## 2016-09-19 DIAGNOSIS — S2241XD Multiple fractures of ribs, right side, subsequent encounter for fracture with routine healing: Secondary | ICD-10-CM | POA: Diagnosis not present

## 2016-09-19 DIAGNOSIS — S42009A Fracture of unspecified part of unspecified clavicle, initial encounter for closed fracture: Secondary | ICD-10-CM | POA: Diagnosis present

## 2016-09-19 DIAGNOSIS — F0280 Dementia in other diseases classified elsewhere without behavioral disturbance: Secondary | ICD-10-CM | POA: Diagnosis not present

## 2016-09-19 DIAGNOSIS — F0390 Unspecified dementia without behavioral disturbance: Secondary | ICD-10-CM | POA: Diagnosis present

## 2016-09-19 LAB — COMPREHENSIVE METABOLIC PANEL
ALBUMIN: 2.6 g/dL — AB (ref 3.5–5.0)
ALK PHOS: 93 U/L (ref 38–126)
ALT: 10 U/L — AB (ref 14–54)
AST: 20 U/L (ref 15–41)
Anion gap: 8 (ref 5–15)
BILIRUBIN TOTAL: 0.7 mg/dL (ref 0.3–1.2)
BUN: 15 mg/dL (ref 6–20)
CO2: 26 mmol/L (ref 22–32)
CREATININE: 0.8 mg/dL (ref 0.44–1.00)
Calcium: 7.7 mg/dL — ABNORMAL LOW (ref 8.9–10.3)
Chloride: 103 mmol/L (ref 101–111)
GFR calc Af Amer: >60 mL/min (ref >60)
GFR calc non Af Amer: >60 mL/min (ref >60)
GLUCOSE: 116 mg/dL — AB (ref 65–99)
POTASSIUM: 2.7 mmol/L — AB (ref 3.5–5.1)
Sodium: 137 mmol/L (ref 135–145)
TOTAL PROTEIN: 5.3 g/dL — AB (ref 6.5–8.1)

## 2016-09-19 LAB — CBC
HCT: 33.3 % — ABNORMAL LOW (ref 36.0–46.0)
HEMOGLOBIN: 11.1 g/dL — AB (ref 12.0–15.0)
MCH: 28.8 pg (ref 26.0–34.0)
MCHC: 33.3 g/dL (ref 30.0–36.0)
MCV: 86.3 fL (ref 78.0–100.0)
Platelets: 254 10*3/uL (ref 150–400)
RBC: 3.86 MIL/uL — AB (ref 3.87–5.11)
RDW: 14.6 % (ref 11.5–15.5)
WBC: 9 10*3/uL (ref 4.0–10.5)

## 2016-09-19 LAB — MAGNESIUM: Magnesium: 1.5 mg/dL — ABNORMAL LOW (ref 1.7–2.4)

## 2016-09-19 LAB — POTASSIUM: Potassium: 3.7 mmol/L (ref 3.5–5.1)

## 2016-09-19 MED ORDER — KCL IN DEXTROSE-NACL 40-5-0.9 MEQ/L-%-% IV SOLN
INTRAVENOUS | Status: DC
  Administered 2016-09-19 – 2016-09-21 (×4): via INTRAVENOUS

## 2016-09-19 MED ORDER — INFLUENZA VAC SPLIT QUAD 0.5 ML IM SUSY
0.5000 mL | PREFILLED_SYRINGE | INTRAMUSCULAR | Status: AC
  Administered 2016-09-20: 0.5 mL via INTRAMUSCULAR
  Filled 2016-09-19: qty 0.5

## 2016-09-19 MED ORDER — POTASSIUM CHLORIDE CRYS ER 20 MEQ PO TBCR: 40.0000 meq | EXTENDED_RELEASE_TABLET | Freq: Once | ORAL | Status: DC

## 2016-09-19 MED ORDER — POTASSIUM CHLORIDE 10 MEQ/100ML IV SOLN
10.0000 meq | INTRAVENOUS | Status: AC
  Administered 2016-09-19 (×3): 10 meq via INTRAVENOUS
  Filled 2016-09-19 (×3): qty 100

## 2016-09-19 MED ORDER — SODIUM CHLORIDE 0.9 % IV SOLN: INTRAVENOUS | Status: DC

## 2016-09-19 NOTE — Consult Note (Signed)
Consultation Note Date: 09/19/2016   Patient Name: Yolanda Martin  DOB: 1934-03-10  MRN: [id number]  Age / Sex: 83 y.o., female  PCP: [name], MD Referring Physician: Maxie Barb, MD  Reason for Consultation: Establishing goals of care and Psychosocial/spiritual support  HPI/Patient Profile: 83 y.o. female  with past medical history of arthritis, Atherosclerosis with PVD, mama dementia, tobacco use, ulcer of lower limb admitted on 09/18/2016 with failure to thrive, altered mental status.   Clinical Assessment and Goals of Care: Yolanda Martin is resting in bed, she is unable to open her eyes to command or touch, and does not communicate in any meaningful way. She is clearly unable to care for her self in the future.  Call to daughter, Council Mechanic.  She shares her worry over her mother, stating repeatedly that she is unable to care for her mother and her own home. I share with Bonita Quin the realities of Yolanda Martin's needs. I share that she is indeed, unable to care for her mother in her own home. Bonita Quin states that she would like for her mother to be placed in a residential SNF near her home in [place] off [address] city [place], if possible. Limited discussion today. Daughter Bonita Quin agrees to return call tomorrow at 10 AM to continue plan for Yolanda Martin. We need to continue discussions related to advanced directives.  Health care power of attorney NEXT OF KIN - daughter Council Mechanic is only living child.    SUMMARY OF RECOMMENDATIONS   Yolanda Martin will need residential/custodial care in SNF. Daughter Linda's preference is a facility in [place] near her home [facility].  Code Status/Advance Care Planning:  Full code - at this time  Symptom Management:   per hospitalist  Palliative Prophylaxis:   Aspiration, Palliative Wound Care and Turn Reposition  Additional  Recommendations (Limitations, Scope, Preferences):  Treat the treatable, code status discussions needed  Psycho-social/Spiritual:   Desire for further Chaplaincy support:no  Additional Recommendations: Caregiving  Support/Resources, Education on Hospice, Funeral Planning/Counseling and Grief/Bereavement Support  Prognosis:   < 6 months, would not be surprising based on dementia, decline, decreased functional status, nonambulatory for 2 years.  Discharge Planning: Needs long-term residential care placement.      Primary Diagnoses: Present on Admission: . Altered mental status . Fall . Hypokalemia   I have reviewed the medical record, interviewed the patient and family, and examined the patient. The following aspects are pertinent.  Past Medical History:  Diagnosis Date  . Arthritis   . Atherosclerotic PVD with ulceration (HCC)   . Chronic venous insufficiency   . Dementia   . Tobacco use   . Ulcer of lower limb [facility])    Social History   Social History  . Marital status: Married    Spouse name: N/A  . Number of children: N/A  . Years of education: N/A   Social History Main Topics  . Smoking status: Current Every Day Smoker    Packs/day: 1.00  Years: 50.00    Types: Cigarettes  . Smokeless tobacco: Never Used  . Alcohol use No  . Drug use: No  . Sexual activity: Not Asked   Other Topics Concern  . None   Social History Narrative  . None   Family History  Problem Relation Age of Onset  . Cancer Mother   . Heart disease Mother   . Cancer Father   . Cancer Daughter   . Heart disease Brother     before age 81   Scheduled Meds: . amLODipine  5 mg Oral Daily  . benazepril  10 mg Oral Daily  . cefTRIAXone (ROCEPHIN)  IV  1 g Intravenous Q24H  . citalopram  20 mg Oral Daily  . donepezil  10 mg Oral QHS  . enoxaparin (LOVENOX) injection  40 mg Subcutaneous Q24H  . feeding supplement (ENSURE ENLIVE)  237 mL Oral BID BM  . [START ON 09/20/2016]  Influenza vac split quadrivalent PF  0.5 mL Intramuscular Tomorrow-1000  . mirtazapine  7.5 mg Oral QHS  . traZODone  25 mg Oral QHS   Continuous Infusions: . dextrose 5 % and 0.9 % NaCl with KCl 40 mEq/L 75 mL/hr at 09/19/16 1143   PRN Meds:.acetaminophen **OR** acetaminophen, HYDROcodone-acetaminophen, ondansetron **OR** ondansetron (ZOFRAN) IV Medications Prior to Admission:  Prior to Admission medications   Medication Sig Start Date End Date Taking? Authorizing Provider  ALPRAZolam Prudy Feeler(XANAX) 0.5 MG tablet Take 0.5 mg by mouth 2 (two) times daily.  03/18/15  Yes Historical Provider, MD  benazepril (LOTENSIN) 10 MG tablet Take 10 mg by mouth daily. 02/21/15  Yes Historical Provider, MD  Calcium-Phosphorus-Vitamin D (CALCIUM GUMMIES PO) Take 1 tablet by mouth at bedtime.   Yes Historical Provider, MD  cholecalciferol (VITAMIN D) 1000 UNITS tablet Take 1,000 Units by mouth daily.   Yes Historical Provider, MD  donepezil (ARICEPT) 10 MG tablet Take 10 mg by mouth at bedtime.   Yes Historical Provider, MD  furosemide (LASIX) 20 MG tablet Take 1 tablet by mouth daily. 05/10/14  Yes Historical Provider, MD  HYDROcodone-acetaminophen (NORCO/VICODIN) 5-325 MG tablet Take 1 tablet by mouth every 6 (six) hours as needed for moderate pain.   Yes Historical Provider, MD  mirtazapine (REMERON) 15 MG tablet Take 7.5 mg by mouth at bedtime.  02/10/16  Yes Historical Provider, MD  Multiple Vitamins-Minerals (MULTIVITAMIN ADULT PO) Take 1 tablet by mouth daily.   Yes Historical Provider, MD  traZODone (DESYREL) 50 MG tablet Take 25 mg by mouth at bedtime.   Yes Historical Provider, MD  amLODipine (NORVASC) 5 MG tablet Take 5 mg by mouth daily. Reported on 03/01/2016 02/21/15   Historical Provider, MD  citalopram (CELEXA) 20 MG tablet Take 20 mg by mouth daily. Reported on 03/01/2016 02/19/15   Historical Provider, MD  ranitidine (ZANTAC) 300 MG tablet Take 300 mg by mouth daily. 04/16/15   Historical Provider, MD   No  Known Allergies Review of Systems  Unable to perform ROS: Acuity of condition    Physical Exam  Constitutional: No distress.  Does not open eyes to command, no meaningful communication.  HENT:  Head: Normocephalic and atraumatic.  Cardiovascular: Normal rate and regular rhythm.   Pulmonary/Chest: Effort normal. No respiratory distress.  Abdominal: Soft. She exhibits no distension. There is no guarding.  Neurological:  lethargic  Skin: Skin is warm and dry.  Raising and burns  Nursing note and vitals reviewed.   Vital Signs: BP (!) 141/52 (BP Location: Left Arm)  Pulse 67   Temp 98.3 F (36.8 C) (Axillary)   Resp 16   Ht 5\' 5"  (1.651 m)   Wt 47.6 kg (105 lb)   SpO2 96%   BMI 17.47 kg/m  Pain Assessment: PAINAD   Pain Score: Asleep   SpO2: SpO2: 96 % O2 Device:SpO2: 96 % O2 Flow Rate: .   IO: Intake/output summary:  Intake/Output Summary (Last 24 hours) at 09/19/16 1554 Last data filed at 09/19/16 1526  Gross per 24 hour  Intake           672.08 ml  Output                0 ml  Net           672.08 ml    LBM: Last BM Date:  (Unknown, PTA, + Flatus and BS) Baseline Weight: Weight: 47.6 kg (105 lb) Most recent weight: Weight: 47.6 kg (105 lb)     Palliative Assessment/Data:   Flowsheet Rows   Flowsheet Row Most Recent Value  Intake Tab  Referral Department  Hospitalist  Unit at Time of Referral  Med/Surg Unit  Palliative Care Primary Diagnosis  Neurology  Date Notified  09/19/16  Palliative Care Type  New Palliative care  Reason for referral  Clarify Goals of Care  Date of Admission  09/18/16  Date first seen by Palliative Care  09/19/16  # of days Palliative referral response time  0 Day(s)  # of days IP prior to Palliative referral  1  Clinical Assessment  Palliative Performance Scale Score  20%  Pain Max last 24 hours  Not able to report  Pain Min Last 24 hours  Not able to report  Dyspnea Max Last 24 Hours  Not able to report  Dyspnea Min Last  24 hours  Not able to report  Psychosocial & Spiritual Assessment  Palliative Care Outcomes  Patient/Family meeting held?  Yes  Who was at the meeting?  Daughter Bonita Quin via phone  Palliative Care Outcomes  Provided psychosocial or spiritual support      Time In: 1520 Time Out: 1550 Time Total: 30 minutes Greater than 50%  of this time was spent counseling and coordinating care related to the above assessment and plan.  Signed by: Katheran Awe, NP   Please contact Palliative Medicine Team phone at [phone] for questions and concerns.  For individual provider: See Loretha Stapler
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
## 2016-09-19 NOTE — Clinical Social Work Placement (Signed)
   CLINICAL SOCIAL WORK PLACEMENT  NOTE  Date:  09/19/2016  Patient Details  Name: Georgina QuintBetty R Jons MRN: [id number] Date of Birth: 05-01-1934  Clinical Social Work is seeking post-discharge placement for this patient at the Skilled  Nursing Facility level of care (*CSW will initial, date and re-position this form in  chart as items are completed):  Yes   Patient/family provided with [facility] Clinical Social Work Department's list of facilities offering this level of care within the geographic area requested by the patient (or if unable, by the patient's family).  Yes   Patient/family informed of their freedom to choose among providers that offer the needed level of care, that participate in Medicare, Medicaid or managed care program needed by the patient, have an available bed and are willing to accept the patient.  Yes   Patient/family informed of [facility]'s ownership interest in [facility] and [facility], as well as of the fact that they are under no obligation to receive care at these facilities.  PASRR submitted to EDS on       PASRR number received on       Existing PASRR number confirmed on 09/19/16     FL2 transmitted to all facilities in geographic area requested by pt/family on 09/19/16     FL2 transmitted to all facilities within larger geographic area on       Patient informed that his/her managed care company has contracts with or will negotiate with certain facilities, including the following:            Patient/family informed of bed offers received.  Patient chooses bed at       Physician recommends and patient chooses bed at      Patient to be transferred to   on  .  Patient to be transferred to facility by       Patient family notified on   of transfer.  Name of family member notified:        PHYSICIAN Please sign FL2     Additional Comment:    _______________________________________________ Raye Sorrowoble, Loreli Debruler N, LCSW 09/19/2016, 12:25  PM
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
## 2016-09-19 NOTE — Progress Notes (Addendum)
LCSW has staffed case with Juliette AlcideMelinda at DSS ([facility]). Discussed concerns daughter has brought forth with regards to not wanting to care for patient due to her smoking habits and inability to manage patient.  Plan was for daughter to take her home, but as stated above daughter has decided she does not want too.  Juliette AlcideMelinda was updated on patient progress and prognosis/observation status. DSS will staff with co-worker and supervisor on next steps and plan. Spoke with Juliette AlcideMelinda again, awaiting recommendations from MD regarding course of treatment (if we will treat patient or will she remain in observation)  and if patient has capacity to make her own decisions.    Will follow up once DSS makes decision with regards to their involvement and report.  Deretha EmoryHannah Dajanee Voorheis LCSW, MSW Clinical Social Work: Optician, dispensingystem Wide Float Coverage for :  [phone]

## 2016-09-19 NOTE — [place] FL2 (Signed)
[place] MEDICAID FL2 LEVEL OF CARE SCREENING TOOL     IDENTIFICATION  Patient Name: Yolanda Martin Birthdate: October 20, 1933 Sex: female Admission Date (Current Location): 09/18/2016  [facility] and [place] Number:  [employer] and Address:  [facility],  [address], [place] [address]      Provider Number: [id number]  Attending Physician Name and Address:  Maxie Barb, MD  Relative Name and Phone Number:       Current Level of Care: Hospital Recommended Level of Care: Skilled Nursing Facility Prior Approval Number:    Date Approved/Denied:   PASRR Number: [id number]  Discharge Plan: SNF    Current Diagnoses: Patient Active Problem List   Diagnosis Date Noted  . Hypokalemia 09/19/2016  . Urinary tract infection without hematuria   . Altered mental status 09/18/2016  . Fall 09/18/2016  . Essential hypertension, benign 07/01/2014  . Ulcer of lower limb, unspecified 03/11/2014  . Atherosclerotic PVD with ulceration (HCC) 03/11/2014    Orientation RESPIRATION BLADDER Height & Weight     Self  Normal Incontinent Weight: 105 lb (47.6 kg) Height:  5\' 5"  (165.1 cm)  BEHAVIORAL SYMPTOMS/MOOD NEUROLOGICAL BOWEL NUTRITION STATUS      Incontinent Diet (see DC summary)  AMBULATORY STATUS COMMUNICATION OF NEEDS Skin   Extensive Assist Verbally Other (Comment) (Venous stasis ulcer, wound dressing: daily)                       Personal Care Assistance Level of Assistance  Bathing, Feeding, Dressing Bathing Assistance: Maximum assistance Feeding assistance: Maximum assistance Dressing Assistance: Maximum assistance     Functional Limitations Info  Sight, Hearing, Speech Sight Info: Adequate Hearing Info: Adequate Speech Info: Adequate    SPECIAL CARE FACTORS FREQUENCY  PT (By licensed PT), OT (By licensed OT)     PT Frequency: 5x OT Frequency: 5x            Contractures Contractures Info: Not present    Additional  Factors Info  Code Status, Allergies, Psychotropic Code Status Info: Full Code Allergies Info: NKA Psychotropic Info: Celexa, Aricept         Current Medications (09/19/2016):  This is the current hospital active medication list Current Facility-Administered Medications  Medication Dose Route Frequency Provider Last Rate Last Dose  . acetaminophen (TYLENOL) tablet 650 mg  650 mg Oral Q6H PRN Meredeth Ide, MD   650 mg at 09/18/16 1940   Or  . acetaminophen (TYLENOL) suppository 650 mg  650 mg Rectal Q6H PRN Meredeth Ide, MD      . amLODipine (NORVASC) tablet 5 mg  5 mg Oral Daily Meredeth Ide, MD   5 mg at 09/18/16 1841  . benazepril (LOTENSIN) tablet 10 mg  10 mg Oral Daily Meredeth Ide, MD   10 mg at 09/18/16 1841  . cefTRIAXone (ROCEPHIN) 1 g in dextrose 5 % 50 mL IVPB  1 g Intravenous Q24H Meredeth Ide, MD      . citalopram (CELEXA) tablet 20 mg  20 mg Oral Daily Meredeth Ide, MD   20 mg at 09/18/16 1841  . dextrose 5 % and 0.9 % NaCl with KCl 40 mEq/L infusion   Intravenous Continuous Dron Jaynie Collins, MD 75 mL/hr at 09/19/16 1143    . donepezil (ARICEPT) tablet 10 mg  10 mg Oral QHS Meredeth Ide, MD   10 mg at 09/18/16 2230  . enoxaparin (LOVENOX) injection 40 mg  40 mg Subcutaneous Q24H Meredeth IdeGagan S Lama, MD   40 mg at 09/18/16 1841  . feeding supplement (ENSURE ENLIVE) (ENSURE ENLIVE) liquid 237 mL  237 mL Oral BID BM Meredeth IdeGagan S Lama, MD      . HYDROcodone-acetaminophen (NORCO/VICODIN) 5-325 MG per tablet 1 tablet  1 tablet Oral Q6H PRN Meredeth IdeGagan S Lama, MD   1 tablet at 09/18/16 1841  . [START ON 09/20/2016] Influenza vac split quadrivalent PF (FLUARIX) injection 0.5 mL  0.5 mL Intramuscular Tomorrow-1000 Dron Jaynie CollinsPrasad Bhandari, MD      . mirtazapine (REMERON) tablet 7.5 mg  7.5 mg Oral QHS Meredeth IdeGagan S Lama, MD   7.5 mg at 09/18/16 2229  . ondansetron (ZOFRAN) tablet 4 mg  4 mg Oral Q6H PRN Meredeth IdeGagan S Lama, MD       Or  . ondansetron (ZOFRAN) injection 4 mg  4 mg Intravenous Q6H PRN Meredeth IdeGagan S Lama, MD       . potassium chloride 10 mEq in 100 mL IVPB  10 mEq Intravenous Q1 Hr x 3 Dron Jaynie CollinsPrasad Bhandari, MD   10 mEq at 09/19/16 1141  . traZODone (DESYREL) tablet 25 mg  25 mg Oral QHS Meredeth IdeGagan S Lama, MD   25 mg at 09/18/16 2230     Discharge Medications: Please see discharge summary for a list of discharge medications.  Relevant Imaging Results:  Relevant Lab Results:   Additional Information SSN:  [id number]   Raye SorrowCoble, Talullah Abate N, [place]
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
## 2016-09-20 DIAGNOSIS — Z7189 Other specified counseling: Secondary | ICD-10-CM

## 2016-09-20 DIAGNOSIS — R400 Somnolence: Secondary | ICD-10-CM

## 2016-09-20 LAB — BASIC METABOLIC PANEL
Anion gap: 5 (ref 5–15)
BUN: 8 mg/dL (ref 6–20)
CALCIUM: 8.1 mg/dL — AB (ref 8.9–10.3)
CO2: 25 mmol/L (ref 22–32)
CREATININE: 0.66 mg/dL (ref 0.44–1.00)
Chloride: 109 mmol/L (ref 101–111)
GFR calc Af Amer: >60 mL/min (ref >60)
GLUCOSE: 125 mg/dL — AB (ref 65–99)
Potassium: 3.3 mmol/L — ABNORMAL LOW (ref 3.5–5.1)
Sodium: 139 mmol/L (ref 135–145)

## 2016-09-20 LAB — CARBOXYHEMOGLOBIN - COOX: Carboxyhemoglobin: 3.1 % — ABNORMAL HIGH (ref 0.5–1.5)

## 2016-09-20 MED ORDER — POTASSIUM CHLORIDE CRYS ER 20 MEQ PO TBCR: 40.0000 meq | EXTENDED_RELEASE_TABLET | Freq: Four times a day (QID) | ORAL | Status: DC

## 2016-09-20 MED ORDER — MAGNESIUM SULFATE 2 GM/50ML IV SOLN
2.0000 g | Freq: Once | INTRAVENOUS | Status: AC
Start: 2016-09-20 — End: 2016-09-20
  Administered 2016-09-20: 2 g via INTRAVENOUS
  Filled 2016-09-20: qty 50

## 2016-09-20 MED ORDER — POTASSIUM CHLORIDE CRYS ER 20 MEQ PO TBCR
40.0000 meq | EXTENDED_RELEASE_TABLET | Freq: Once | ORAL | Status: AC
  Administered 2016-09-20: 40 meq via ORAL
  Filled 2016-09-20: qty 2

## 2016-09-20 NOTE — Clinical Social Work Note (Signed)
CSW spoke with pt's daughter who [facility]. Linda plans on this being long term. Facility aware. CSW notified Hiram GashMelinda Spell at DSS that pt will go to [facility] when medically stable.   Derenda FennelKara Jaz Laningham, LCSW [phone]
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
## 2016-09-20 NOTE — Care Management Note (Signed)
Case Management Note  Patient Details  Name: Georgina QuintBetty R Krogstad MRN: [id number] Date of Birth: 13-Jul-1934  Subjective/Objective:       [facility] Husband deceased, patient had dementia.  Still AMS currently.    Action/Plan: Anticipate SNF at time of discharge. CM following.   Expected Discharge Date:       09/22/2016           Expected Discharge Plan:  Skilled Nursing Facility  In-House Referral:  Clinical Social Work  Discharge planning Services  CM Consult  Post Acute Care Choice:    Choice offered to:     DME Arranged:    DME Agency:     HH Arranged:    HH Agency:     Status of Service:  In process, will continue to follow  If discussed at Long Length of Stay Meetings, dates discussed:    Additional Comments:  Zamani Crocker, Chrystine OilerSharley Diane, RN 09/20/2016, 4:30 PM
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
## 2016-09-20 NOTE — Clinical Social Work Placement (Signed)
   CLINICAL SOCIAL WORK PLACEMENT  NOTE  Date:  09/20/2016  Patient Details  Name: [name] MRN: [id number] Date of Birth: 10-27-1933  Clinical Social Work is seeking post-discharge placement for this patient at the Skilled  Nursing Facility level of care (*CSW will initial, date and re-position this form in  chart as items are completed):  Yes   Patient/family provided with [facility] Clinical Social Work Department's list of facilities offering this level of care within the geographic area requested by the patient (or if unable, by the patient's family).  Yes   Patient/family informed of their freedom to choose among providers that offer the needed level of care, that participate in Medicare, Medicaid or managed care program needed by the patient, have an available bed and are willing to accept the patient.  Yes   Patient/family informed of [facility]'s ownership interest in [facility] and [facility], as well as of the fact that they are under no obligation to receive care at these facilities.  PASRR submitted to EDS on       PASRR number received on       Existing PASRR number confirmed on 09/19/16     FL2 transmitted to all facilities in geographic area requested by pt/family on 09/19/16     FL2 transmitted to all facilities within larger geographic area on       Patient informed that his/her managed care company has contracts with or will negotiate with certain facilities, including the following:        Yes   Patient/family informed of bed offers received.  [facility]     Physician recommends and patient chooses bed at      [facility] on  .  Patient to be transferred to facility by       Patient family notified on   of transfer.  Name of family member notified:        PHYSICIAN Please sign FL2     Additional Comment:     _______________________________________________ Karn CassisStultz, Alitza Cowman Shanaberger, LCSW 09/20/2016, 9:18 AM [phone]
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
## 2016-09-20 NOTE — Progress Notes (Addendum)
Initial Nutrition Assessment  DOCUMENTATION CODES:   Non-severe (moderate) malnutrition in context of chronic illness, Underweight  INTERVENTION:  Agree with Regular diet   Ensure Enlive po BID, each supplement provides 350 kcal and 20 grams of protein   Provide assistance with feeding all meals and with oral supplements  Strongly recommend Yolanda Boniface be discharged with: Ensure Enlive po BID and ProStat BID daily for at least a month after discharge  NUTRITION DIAGNOSIS:   Malnutrition related to poor appetite, chronic illness as evidenced by mild depletion of body fat, moderate depletions of muscle mass and dehydrated state on admission.    GOAL:  Meet nutrition needs as able given her dementia disease progression and poor appetite      MONITOR:   PO intake, Supplement acceptance, Weight trends, Skin  REASON FOR ASSESSMENT:   Malnutrition Screening Tool    ASSESSMENT: Yolanda Martin is an 83 yo who presents dehydrated with FTT  and appears disheveled and poorly cared for.   She was given Ativan early yesterday and slept through breakfast and lunch. She was awake in the evening for dinner and ate 60% per nursing records. She is unable to feed herself. Unclear how advanced her dementia is. She was not able to provide diet hx and her daughter is not here at present. She has been on Remeron since last June a medication that is sometimes prescribed to assist with improving appetite. This suggest she has not been eating well for some time.   She has number of bruises and on her legs and skin is dry and scaly. She has venous statis ulcer.   [facility] with right clavicle fracture right-sided rib fractures.    Multiple falls per her hx. Weight at that time 105# (47.6 kg). It appears from her weight hx her usual 43-45 kg.  Nutrition-Focused physical exam completed. Findings are mild fat depletion, moderate muscle depletion, and no LE edema.      Recent Labs Lab  09/18/16 1326 09/19/16 0611 09/19/16 1513 09/20/16 0534  NA 139 137  --  139  K 2.8* 2.7* 3.7 3.3*  CL 101 103  --  109  CO2 25 26  --  25  BUN 15 15  --  8  CREATININE 0.73 0.80  --  0.66  CALCIUM 8.5* 7.7*  --  8.1*  MG 1.7  --  1.5*  --   GLUCOSE 82 116*  --  125*    Labs: Potassium 3.3, Albumin 2.6   Diet Order:  Diet regular Room service appropriate? Yes; Fluid consistency: Thin  Skin:   venous status, many bruises, dry scaly to lower legs bilaterally   Last BM:  1/23 small Type 1 (seperate hard lumps)  Height:   Ht Readings from Last 1 Encounters:  09/18/16 5\' 5"  (1.651 m)    Weight:   Wt Readings from Last 1 Encounters:  09/18/16 105 lb (47.6 kg)    Ideal Body Weight:  57 kg  BMI:  Body mass index is 17.47 kg/m.  Estimated Nutritional Needs:   Kcal:  1425-1710 (to promote healing)  Protein:  68-80 gr (for repletion)  Fluid:  >1200 ml daily  EDUCATION NEEDS:   No education needs identified at this time  Royann ShiversLynn [place] Yolanda,RD,CSG,LDN Office: [phone] Pager: [phone]

## 2016-09-21 LAB — URINE CULTURE

## 2016-09-21 LAB — MAGNESIUM: Magnesium: 1.8 mg/dL (ref 1.7–2.4)

## 2016-09-21 MED ORDER — MAGNESIUM SULFATE 2 GM/50ML IV SOLN
2.0000 g | Freq: Once | INTRAVENOUS | Status: AC
  Administered 2016-09-21: 2 g via INTRAVENOUS
  Filled 2016-09-21: qty 50

## 2016-09-21 MED ORDER — SULFAMETHOXAZOLE-TRIMETHOPRIM 800-160 MG PO TABS
1.0000 | ORAL_TABLET | Freq: Two times a day (BID) | ORAL | Status: DC
  Administered 2016-09-21 – 2016-09-22 (×3): 1 via ORAL
  Filled 2016-09-21 (×3): qty 1

## 2016-09-21 MED ORDER — DEXTROSE-NACL 5-0.45 % IV SOLN
INTRAVENOUS | Status: DC
  Administered 2016-09-21 (×2): via INTRAVENOUS

## 2016-09-21 MED ORDER — LORAZEPAM 2 MG/ML IJ SOLN
0.5000 mg | INTRAMUSCULAR | Status: AC | PRN
  Administered 2016-09-21 – 2016-09-22 (×2): 0.5 mg via INTRAVENOUS
  Filled 2016-09-21 (×2): qty 1

## 2016-09-21 MED ORDER — POTASSIUM CHLORIDE CRYS ER 20 MEQ PO TBCR
40.0000 meq | EXTENDED_RELEASE_TABLET | Freq: Four times a day (QID) | ORAL | Status: AC
  Administered 2016-09-21 (×2): 40 meq via ORAL
  Filled 2016-09-21 (×2): qty 2
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
## 2016-09-22 DIAGNOSIS — R400 Somnolence: Secondary | ICD-10-CM | POA: Diagnosis not present

## 2016-09-22 DIAGNOSIS — I480 Paroxysmal atrial fibrillation: Secondary | ICD-10-CM | POA: Diagnosis not present

## 2016-09-22 DIAGNOSIS — R279 Unspecified lack of coordination: Secondary | ICD-10-CM | POA: Diagnosis not present

## 2016-09-22 DIAGNOSIS — S42001A Fracture of unspecified part of right clavicle, initial encounter for closed fracture: Secondary | ICD-10-CM | POA: Diagnosis not present

## 2016-09-22 DIAGNOSIS — I7025 Atherosclerosis of native arteries of other extremities with ulceration: Secondary | ICD-10-CM | POA: Diagnosis not present

## 2016-09-22 DIAGNOSIS — I739 Peripheral vascular disease, unspecified: Secondary | ICD-10-CM | POA: Diagnosis not present

## 2016-09-22 DIAGNOSIS — M129 Arthropathy, unspecified: Secondary | ICD-10-CM | POA: Diagnosis not present

## 2016-09-22 DIAGNOSIS — F418 Other specified anxiety disorders: Secondary | ICD-10-CM | POA: Diagnosis not present

## 2016-09-22 DIAGNOSIS — Y92019 Unspecified place in single-family (private) house as the place of occurrence of the external cause: Secondary | ICD-10-CM | POA: Diagnosis not present

## 2016-09-22 DIAGNOSIS — Y92009 Unspecified place in unspecified non-institutional (private) residence as the place of occurrence of the external cause: Secondary | ICD-10-CM | POA: Diagnosis not present

## 2016-09-22 DIAGNOSIS — R197 Diarrhea, unspecified: Secondary | ICD-10-CM | POA: Diagnosis not present

## 2016-09-22 DIAGNOSIS — Z7189 Other specified counseling: Secondary | ICD-10-CM | POA: Diagnosis not present

## 2016-09-22 DIAGNOSIS — Z9181 History of falling: Secondary | ICD-10-CM | POA: Diagnosis not present

## 2016-09-22 DIAGNOSIS — R627 Adult failure to thrive: Secondary | ICD-10-CM | POA: Diagnosis not present

## 2016-09-22 DIAGNOSIS — Z515 Encounter for palliative care: Secondary | ICD-10-CM | POA: Diagnosis not present

## 2016-09-22 DIAGNOSIS — Z23 Encounter for immunization: Secondary | ICD-10-CM | POA: Diagnosis not present

## 2016-09-22 DIAGNOSIS — E878 Other disorders of electrolyte and fluid balance, not elsewhere classified: Secondary | ICD-10-CM | POA: Diagnosis not present

## 2016-09-22 DIAGNOSIS — Z7401 Bed confinement status: Secondary | ICD-10-CM | POA: Diagnosis not present

## 2016-09-22 DIAGNOSIS — I10 Essential (primary) hypertension: Secondary | ICD-10-CM | POA: Diagnosis not present

## 2016-09-22 DIAGNOSIS — F0280 Dementia in other diseases classified elsewhere without behavioral disturbance: Secondary | ICD-10-CM | POA: Diagnosis not present

## 2016-09-22 DIAGNOSIS — N390 Urinary tract infection, site not specified: Secondary | ICD-10-CM | POA: Diagnosis not present

## 2016-09-22 DIAGNOSIS — E43 Unspecified severe protein-calorie malnutrition: Secondary | ICD-10-CM | POA: Diagnosis not present

## 2016-09-22 DIAGNOSIS — S2241XD Multiple fractures of ribs, right side, subsequent encounter for fracture with routine healing: Secondary | ICD-10-CM | POA: Diagnosis not present

## 2016-09-22 DIAGNOSIS — K219 Gastro-esophageal reflux disease without esophagitis: Secondary | ICD-10-CM | POA: Diagnosis not present

## 2016-09-22 DIAGNOSIS — R41841 Cognitive communication deficit: Secondary | ICD-10-CM | POA: Diagnosis not present

## 2016-09-22 DIAGNOSIS — W19XXXA Unspecified fall, initial encounter: Secondary | ICD-10-CM | POA: Diagnosis not present

## 2016-09-22 DIAGNOSIS — F419 Anxiety disorder, unspecified: Secondary | ICD-10-CM | POA: Diagnosis not present

## 2016-09-22 DIAGNOSIS — F0390 Unspecified dementia without behavioral disturbance: Secondary | ICD-10-CM | POA: Diagnosis not present

## 2016-09-22 DIAGNOSIS — Z1612 Extended spectrum beta lactamase (ESBL) resistance: Secondary | ICD-10-CM | POA: Diagnosis not present

## 2016-09-22 DIAGNOSIS — S42021A Displaced fracture of shaft of right clavicle, initial encounter for closed fracture: Secondary | ICD-10-CM | POA: Diagnosis not present

## 2016-09-22 DIAGNOSIS — J449 Chronic obstructive pulmonary disease, unspecified: Secondary | ICD-10-CM | POA: Diagnosis not present

## 2016-09-22 DIAGNOSIS — Z720 Tobacco use: Secondary | ICD-10-CM | POA: Diagnosis not present

## 2016-09-22 DIAGNOSIS — E876 Hypokalemia: Secondary | ICD-10-CM | POA: Diagnosis not present

## 2016-09-22 DIAGNOSIS — L97322 Non-pressure chronic ulcer of left ankle with fat layer exposed: Secondary | ICD-10-CM | POA: Diagnosis not present

## 2016-09-22 DIAGNOSIS — S42001D Fracture of unspecified part of right clavicle, subsequent encounter for fracture with routine healing: Secondary | ICD-10-CM | POA: Diagnosis not present

## 2016-09-22 DIAGNOSIS — L97909 Non-pressure chronic ulcer of unspecified part of unspecified lower leg with unspecified severity: Secondary | ICD-10-CM | POA: Diagnosis not present

## 2016-09-22 DIAGNOSIS — R1312 Dysphagia, oropharyngeal phase: Secondary | ICD-10-CM | POA: Diagnosis not present

## 2016-09-22 DIAGNOSIS — R4182 Altered mental status, unspecified: Secondary | ICD-10-CM | POA: Diagnosis not present

## 2016-09-22 DIAGNOSIS — F329 Major depressive disorder, single episode, unspecified: Secondary | ICD-10-CM | POA: Diagnosis not present

## 2016-09-22 DIAGNOSIS — R5381 Other malaise: Secondary | ICD-10-CM | POA: Diagnosis not present

## 2016-09-22 DIAGNOSIS — M6281 Muscle weakness (generalized): Secondary | ICD-10-CM | POA: Diagnosis not present

## 2016-09-22 LAB — BASIC METABOLIC PANEL
ANION GAP: 5 (ref 5–15)
BUN: 7 mg/dL (ref 6–20)
CALCIUM: 8.5 mg/dL — AB (ref 8.9–10.3)
CO2: 24 mmol/L (ref 22–32)
Chloride: 105 mmol/L (ref 101–111)
Creatinine, Ser: 0.97 mg/dL (ref 0.44–1.00)
GFR, EST NON AFRICAN AMERICAN: 54 mL/min — AB (ref >60)
Glucose, Bld: 104 mg/dL — ABNORMAL HIGH (ref 65–99)
Potassium: 5.6 mmol/L — ABNORMAL HIGH (ref 3.5–5.1)
SODIUM: 134 mmol/L — AB (ref 135–145)

## 2016-09-22 LAB — MAGNESIUM: MAGNESIUM: 2 mg/dL (ref 1.7–2.4)

## 2016-09-22 MED ORDER — SULFAMETHOXAZOLE-TRIMETHOPRIM 800-160 MG PO TABS: 1.0000 | ORAL_TABLET | Freq: Two times a day (BID) | ORAL | Status: DC

## 2016-09-22 MED ORDER — SODIUM CHLORIDE 0.9 % IV BOLUS (SEPSIS)
500.0000 mL | Freq: Once | INTRAVENOUS | Status: AC
  Administered 2016-09-22: 500 mL via INTRAVENOUS

## 2016-09-22 MED ORDER — ALPRAZOLAM 0.5 MG PO TABS: 0.5000 mg | ORAL_TABLET | Freq: Every day | ORAL | 0 refills | Status: DC

## 2016-09-22 MED ORDER — SODIUM POLYSTYRENE SULFONATE 15 GM/60ML PO SUSP
15.0000 g | Freq: Once | ORAL | Status: AC
  Administered 2016-09-22: 15 g via ORAL
  Filled 2016-09-22: qty 60

## 2016-09-22 MED ORDER — FUROSEMIDE 20 MG PO TABS
20.0000 mg | ORAL_TABLET | Freq: Once | ORAL | Status: AC
  Administered 2016-09-22: 20 mg via ORAL
  Filled 2016-09-22: qty 1

## 2016-09-22 NOTE — Discharge Instructions (Signed)
Follow with Primary MD Alysia PennaHOLWERDA, SCOTT, MD in 7 days   Get CBC, CMP, 2 view Chest X ray checked  by Primary MD or SNF MD in 5-7 days ( we routinely change or add medications that can affect your baseline labs and fluid status, therefore we recommend that you get the mentioned basic workup next visit with your PCP, your PCP may decide not to get them or add new tests based on their clinical decision)   Activity: As tolerated with Full fall precautions use walker/cane & assistance as needed   Disposition SNF   Diet:   DIET DYS 2 with feeding assistance and aspiration precautions.  For Heart failure patients - Check your Weight same time everyday, if you gain over 2 pounds, or you develop in leg swelling, experience more shortness of breath or chest pain, call your Primary MD immediately. Follow Cardiac Low Salt Diet and 1.5 lit/day fluid restriction.   On your next visit with your primary care physician please Get Medicines reviewed and adjusted.   Please request your Prim.MD to go over all Hospital Tests and Procedure/Radiological results at the follow up, please get all Hospital records sent to your Prim MD by signing hospital release before you go home.   If you experience worsening of your admission symptoms, develop shortness of breath, life threatening emergency, suicidal or homicidal thoughts you must seek medical attention immediately by calling 911 or calling your MD immediately  if symptoms less severe.  You Must read complete instructions/literature along with all the possible adverse reactions/side effects for all the Medicines you take and that have been prescribed to you. Take any new Medicines after you have completely understood and accpet all the possible adverse reactions/side effects.   Do not drive, operate heavy machinery, perform activities at heights, swimming or participation in water activities or provide baby sitting services if your were admitted for syncope or  siezures until you have seen by Primary MD or a Neurologist and advised to do so again.  Do not drive when taking Pain medications.    Do not take more than prescribed Pain, Sleep and Anxiety Medications  Special Instructions: If you have smoked or chewed Tobacco  in the last 2 yrs please stop smoking, stop any regular Alcohol  and or any Recreational drug use.  Wear Seat belts while driving.   Please note  You were cared for by a hospitalist during your hospital stay. If you have any questions about your discharge medications or the care you received while you were in the hospital after you are discharged, you can call the unit and asked to speak with the hospitalist on call if the hospitalist that took care of you is not available. Once you are discharged, your primary care physician will handle any further medical issues. Please note that NO REFILLS for any discharge medications will be authorized once you are discharged, as it is imperative that you return to your primary care physician (or establish a relationship with a primary care physician if you do not have one) for your aftercare needs so that they can reassess your need for medications and monitor your lab values.
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
## 2016-09-22 NOTE — Clinical Social Work Placement (Signed)
   CLINICAL SOCIAL WORK PLACEMENT  NOTE  Date:  09/22/2016  Patient Details  Name: Yolanda QuintBetty R Villegas MRN: [id number] Date of Birth: 1934-07-25  Clinical Social Work is seeking post-discharge placement for this patient at the Skilled  Nursing Facility level of care (*CSW will initial, date and re-position this form in  chart as items are completed):  Yes   Patient/family provided with [facility] Clinical Social Work Department's list of facilities offering this level of care within the geographic area requested by the patient (or if unable, by the patient's family).  Yes   Patient/family informed of their freedom to choose among providers that offer the needed level of care, that participate in Medicare, Medicaid or managed care program needed by the patient, have an available bed and are willing to accept the patient.  Yes   Patient/family informed of [facility] ownership interest in [facility] and [facility], as well as of the fact that they are under no obligation to receive care at these facilities.  PASRR submitted to EDS on       PASRR number received on       Existing PASRR number confirmed on 09/19/16     FL2 transmitted to all facilities in geographic area requested by pt/family on 09/19/16     FL2 transmitted to all facilities within larger geographic area on       Patient informed that his/her managed care company has contracts with or will negotiate with certain facilities, including the following:        Yes   Patient/family informed of bed offers received.  [facility]     Physician recommends and patient chooses bed at      Patient to be [facility] on 09/22/16.  Patient [facility]     Patient family notified on 09/22/16 of transfer.  Name of family member notified:  Bonita QuinLinda- daughter     PHYSICIAN       Additional Comment:    _______________________________________________ Karn CassisStultz, Garyn [place], LCSW 09/22/2016, 10:59 AM [phone]

## 2016-09-22 NOTE — Progress Notes (Signed)
Patient discharged to [facility], report called and given to Philis FendtErica Crawford LPN. Patient transported via EMS. IV  D/C'ed. Vital signs stable.
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
## 2016-09-22 NOTE — [place] FL2 (Signed)
[place] MEDICAID [id number] LEVEL OF CARE SCREENING TOOL     IDENTIFICATION  Patient Name: Yolanda Martin Birthdate: 01/18/1934 Sex: female Admission Date (Current Location): 09/18/2016  [facility] and [place] Number:  [employer] and Address:  [facility],  [address] [place] [address]      Provider Number: [id number]  Attending Physician Name and Address:  Leroy Sea, MD  Relative Name and Phone Number:       Current Level of Care: Hospital Recommended Level of Care: Skilled Nursing Facility Prior Approval Number:    Date Approved/Denied:   PASRR Number: [id number]  Discharge Plan: SNF    Current Diagnoses: Patient Active Problem List   Diagnosis Date Noted  . DNR (do not resuscitate) discussion   . Hypokalemia 09/19/2016  . Urinary tract infection without hematuria   . Palliative care encounter   . Goals of care, counseling/discussion   . Altered mental status 09/18/2016  . Fall 09/18/2016  . Essential hypertension, benign 07/01/2014  . Ulcer of lower limb, unspecified 03/11/2014  . Atherosclerotic PVD with ulceration (HCC) 03/11/2014    Orientation RESPIRATION BLADDER Height & Weight     Self  Normal Incontinent Weight: 105 lb (47.6 kg) Height:  5\' 5"  (165.1 cm)  BEHAVIORAL SYMPTOMS/MOOD NEUROLOGICAL BOWEL NUTRITION STATUS      Incontinent Diet (see DC summary)  AMBULATORY STATUS COMMUNICATION OF NEEDS Skin   Total Care Verbally Other (Comment) (Venous stasis ulcer, wound dressing: daily)                       Personal Care Assistance Level of Assistance  Bathing, Feeding, Dressing Bathing Assistance: Maximum assistance Feeding assistance: Maximum assistance Dressing Assistance: Maximum assistance     Functional Limitations Info  Sight, Hearing, Speech Sight Info: Adequate Hearing Info: Adequate Speech Info: Adequate    SPECIAL CARE FACTORS FREQUENCY  PT (By licensed PT), OT (By licensed OT)     PT  Frequency: 5x OT Frequency: 5x            Contractures Contractures Info: Not present    Additional Factors Info  Isolation Precautions, Code Status Code Status Info: DNR Allergies Info: NKA Psychotropic Info: Celexa, Aricept   Isolation Precautions Info: Urine culture positive for E Coli ESBL 09/18/2016     Current Medications (09/22/2016):  This is the current hospital active medication list Current Facility-Administered Medications  Medication Dose Route Frequency Provider Last Rate Last Dose  . amLODipine (NORVASC) tablet 5 mg  5 mg Oral Daily Meredeth Ide, MD   5 mg at 09/22/16 0924  . benazepril (LOTENSIN) tablet 10 mg  10 mg Oral Daily Meredeth Ide, MD   10 mg at 09/22/16 0924  . donepezil (ARICEPT) tablet 10 mg  10 mg Oral QHS Meredeth Ide, MD   10 mg at 09/21/16 2201  . enoxaparin (LOVENOX) injection 40 mg  40 mg Subcutaneous Q24H Meredeth Ide, MD   40 mg at 09/21/16 1931  . feeding supplement (ENSURE ENLIVE) (ENSURE ENLIVE) liquid 237 mL  237 mL Oral BID BM Meredeth Ide, MD   237 mL at 09/21/16 1500  . ondansetron (ZOFRAN) injection 4 mg  4 mg Intravenous Q6H PRN Meredeth Ide, MD   4 mg at 09/20/16 1705  . sodium chloride 0.9 % bolus 500 mL  500 mL Intravenous Once Leroy Sea, MD   500 mL at 09/22/16 0929  . sulfamethoxazole-trimethoprim (BACTRIM  DS,SEPTRA DS) 800-160 MG per tablet 1 tablet  1 tablet Oral Q12H Leroy SeaPrashant K Singh, MD   1 tablet at 09/22/16 [id number]     Discharge Medications: Please see discharge summary for a list of discharge medications.  Relevant Imaging Results:  Relevant Lab Results:   Additional Information SSN:  [id number]   Karn CassisStultz, Rocco Kerkhoff [place], [place] [phone]

## 2016-09-26 ENCOUNTER — Non-Acute Institutional Stay (SKILLED_NURSING_FACILITY): Payer: Medicare Other | Admitting: Internal Medicine

## 2016-09-26 ENCOUNTER — Encounter: Payer: Self-pay | Admitting: Internal Medicine

## 2016-09-26 DIAGNOSIS — S42001A Fracture of unspecified part of right clavicle, initial encounter for closed fracture: Secondary | ICD-10-CM

## 2016-09-26 DIAGNOSIS — I480 Paroxysmal atrial fibrillation: Secondary | ICD-10-CM

## 2016-09-26 DIAGNOSIS — Z720 Tobacco use: Secondary | ICD-10-CM

## 2016-09-26 DIAGNOSIS — F418 Other specified anxiety disorders: Secondary | ICD-10-CM

## 2016-09-26 DIAGNOSIS — E43 Unspecified severe protein-calorie malnutrition: Secondary | ICD-10-CM

## 2016-09-26 DIAGNOSIS — F0390 Unspecified dementia without behavioral disturbance: Secondary | ICD-10-CM | POA: Diagnosis not present

## 2016-09-26 DIAGNOSIS — I7025 Atherosclerosis of native arteries of other extremities with ulceration: Secondary | ICD-10-CM | POA: Diagnosis not present

## 2016-09-26 DIAGNOSIS — E878 Other disorders of electrolyte and fluid balance, not elsewhere classified: Secondary | ICD-10-CM

## 2016-09-26 DIAGNOSIS — K219 Gastro-esophageal reflux disease without esophagitis: Secondary | ICD-10-CM

## 2016-09-26 DIAGNOSIS — R5381 Other malaise: Secondary | ICD-10-CM | POA: Diagnosis not present

## 2016-09-26 DIAGNOSIS — I10 Essential (primary) hypertension: Secondary | ICD-10-CM | POA: Diagnosis not present

## 2016-09-26 DIAGNOSIS — R627 Adult failure to thrive: Secondary | ICD-10-CM | POA: Diagnosis not present

## 2016-09-26 NOTE — Progress Notes (Signed)
Patient ID: Yolanda Martin, female   DOB: Aug 21, 1934, 83 y.o.   MRN: [id number]    HISTORY AND PHYSICAL   DATE: 09/26/2016   Location:    [place] Room Number: 108 A Place of Service: SNF (31)   Extended Emergency Contact Information Primary Emergency Contact: [place]  [place] Phone: [phone] Mobile Phone: [phone] Relation: Daughter  Advanced Directive information Does Patient Have a Medical Advance Directive?: Yes, Type of Advance Directive: Out of facility DNR (pink MOST or yellow form), Pre-existing out of facility DNR order (yellow form or pink MOST form): Yellow form placed in chart (order not valid for inpatient use)  Chief Complaint  Patient presents with  . New Admit To SNF    HPI:  83 yo female seen today as a new admission into SNF following hospital stay for toxic encephalopathy 2/2 UTI and accidental CO poisoning, electrolyte disturbance, dehydration, recent fall at home-->right clavicular fx, HTN and PAF (not on rate controlling med/anticoagulant due to high fall risk). She was found at home confused and deceased spouse/pets also in home. Head CT neg for acute process. Accidental CO poisoning thought to be cause of her sx's as well as death of spouse and pets. carboxyHgb 2.2 on admission but peaked at 3.1. Urine cx (+) > 100K colonies/ml ESBL E coli. She was Rx bactrim. UDS (+) BZDs. Mg as low as 1.5-->2.0; K+ 2.8-->5.6; Cr 0.97; Hgb 11.1; albumin 2.6 at d/c. She presents to SNF for long term care.  Today she reports no concerns. Appetite ok. Sleeps well. She misses her deceased spouse and pets. Her daughter lives nearby and states she has seen her since her admission to SNF. She is a poor historian due to dementia. Hx obtained from chart  HTN - stable on lasix, amlodipine and benazepril  FTT/protein calorie malnutrition - she gets nutritional supplements per facility protocol. Current wt 94.9 lbs. Albumin 2.6. She  takes remeron to stimulate appetite and has MVI  Dementia - takes aricept. No behavioral issues  Depression/anxiety - mood stable on celexa and alprazolam  GERD - stable on zantac  PAF - rate controlled. She takes no medication for rate and no anticoagulant due to high fall risk  PAD/PVD with LE ulceration - followed by wound care.   Past Medical History:  Diagnosis Date  . Arthritis   . Atherosclerotic PVD with ulceration ([place])   . Chronic venous insufficiency   . Dementia   . Tobacco use   . Ulcer of lower limb [facility])     Past Surgical History:  Procedure Laterality Date  . ABDOMINAL AORTAGRAM N/A 04/06/2014   Procedure: ABDOMINAL Maxcine Ham;  Surgeon: Angelia Mould, MD;  Location: [facility] CATH LAB;  Service: Cardiovascular;  Laterality: N/A;  . ABDOMINAL HYSTERECTOMY    . FRACTURE SURGERY     L hip, L wrist    Patient Care Team: Velna Hatchet, MD as PCP - General (Internal Medicine) Aleene Davidson, MD as Referring Physician (Surgery)  Social History   Social History  . Marital status: widowed    Spouse name: N/A  . Number of children: N/A  . Years of education: N/A   Occupational History  . Not on file.   Social History Main Topics  . Smoking status: Current Every Day Smoker    Packs/day: 1.00    Years: 50.00    Types: Cigarettes  . Smokeless tobacco: Never Used  . Alcohol use No  . Drug use:  No  . Sexual activity: Not on file   Other Topics Concern  . Not on file   Social History Narrative  . No narrative on file     reports that she has been smoking Cigarettes.  She has a 50.00 pack-year smoking history. She has never used smokeless tobacco. She reports that she does not drink alcohol or use drugs.  Family History  Problem Relation Age of Onset  . Cancer Mother   . Heart disease Mother   . Cancer Father   . Cancer Daughter   . Heart disease Brother     before age 29   Family Status  Relation Status  . Mother Deceased at age 69    cancer  . Father Deceased at age 30   brain cancer  . Daughter Deceased at age 58   brain cancer  . Brother     Immunization History  Administered Date(s) Administered  . Influenza,inj,Quad PF,36+ Mos 09/20/2016  . PPD Test 09/23/2016  . Tdap 04/28/2015    No Known Allergies  Medications: Patient's Medications  New Prescriptions   No medications on file  Previous Medications   ALPRAZOLAM (XANAX) 0.5 MG TABLET    Take 1 tablet (0.5 mg total) by mouth at bedtime.   AMLODIPINE (NORVASC) 5 MG TABLET    Take 5 mg by mouth daily. Reported on 03/01/2016   BENAZEPRIL (LOTENSIN) 10 MG TABLET    Take 10 mg by mouth daily.   CALCIUM-PHOSPHORUS-VITAMIN D (CALCIUM GUMMIES PO)    Take 1 tablet by mouth at bedtime.   CHOLECALCIFEROL (VITAMIN D) 1000 UNITS TABLET    Take 1,000 Units by mouth daily.   CITALOPRAM (CELEXA) 20 MG TABLET    Take 20 mg by mouth daily. Reported on 03/01/2016   DONEPEZIL (ARICEPT) 10 MG TABLET    Take 10 mg by mouth at bedtime.   FUROSEMIDE (LASIX) 20 MG TABLET    Take 1 tablet by mouth daily.   MIRTAZAPINE (REMERON) 15 MG TABLET    Take 7.5 mg by mouth at bedtime.    MULTIPLE VITAMINS-MINERALS (MULTIVITAMIN ADULT PO)    Take 1 tablet by mouth daily.   RANITIDINE (ZANTAC) 300 MG TABLET    Take 300 mg by mouth daily.  Modified Medications   No medications on file  Discontinued Medications   SULFAMETHOXAZOLE-TRIMETHOPRIM (BACTRIM DS,SEPTRA DS) 800-160 MG TABLET    Take 1 tablet by mouth every 12 (twelve) hours. 5 more doses    Review of Systems  Unable to perform ROS: Dementia    Vitals:   09/26/16 0917  BP: (!) 99/52  Pulse: 72  Resp: 18  Temp: 97.3 F (36.3 C)  TempSrc: Oral  SpO2: 97%  Weight: 94 lb 14.4 oz (43 kg)  Height: 5' 5"  (1.651 m)   Body mass index is 15.79 kg/m.  Physical Exam  Constitutional: She appears well-developed.  Frail appearing, sitting up in bed in NAD  HENT:  Mouth/Throat: Oropharynx is clear and moist. No oropharyngeal  exudate.  MM dry; no oral thush  Eyes: Pupils are equal, round, and reactive to light. No scleral icterus.  Neck: Neck supple. Carotid bruit is not present. No tracheal deviation present. No thyromegaly present.  Cardiovascular: Normal rate, regular rhythm and intact distal pulses.  Exam reveals no gallop and no friction rub.   Murmur (1/6 SEM) heard. No LE edema b/l. no calf TTP.  Pulmonary/Chest: Effort normal and breath sounds normal. No stridor. No respiratory distress. She has  no wheezes. She has no rales.  Abdominal: Soft. Bowel sounds are normal. She exhibits no distension and no mass. There is no hepatomegaly. There is no tenderness. There is no rebound and no guarding.  Musculoskeletal: She exhibits edema and tenderness (right clavicle).  Lymphadenopathy:    She has no cervical adenopathy.  Neurological: She is alert.  Skin: Skin is warm and dry. No rash noted.  B/l LE with hyperpigmentation/post inflammatory changes; multiple skin tears  Psychiatric: She has a normal mood and affect. Her behavior is normal.     Labs reviewed: Admission on 09/18/2016, Discharged on 09/22/2016  Component Date Value Ref Range Status  . Color, Urine 09/18/2016 YELLOW  YELLOW Final  . APPearance 09/18/2016 HAZY* CLEAR Final  . Specific Gravity, Urine 09/18/2016 1.011  1.005 - 1.030 Final  . pH 09/18/2016 5.0  5.0 - 8.0 Final  . Glucose, UA 09/18/2016 NEGATIVE  NEGATIVE mg/dL Final  . Hgb urine dipstick 09/18/2016 MODERATE* NEGATIVE Final  . Bilirubin Urine 09/18/2016 NEGATIVE  NEGATIVE Final  . Ketones, ur 09/18/2016 NEGATIVE  NEGATIVE mg/dL Final  . Protein, ur 09/18/2016 NEGATIVE  NEGATIVE mg/dL Final  . Nitrite 09/18/2016 NEGATIVE  NEGATIVE Final  . Leukocytes, UA 09/18/2016 LARGE* NEGATIVE Final  . RBC / HPF 09/18/2016 0-5  0 - 5 RBC/hpf Final  . WBC, UA 09/18/2016 6-30  0 - 5 WBC/hpf Final  . Bacteria, UA 09/18/2016 FEW* NONE SEEN Final  . Hyaline Casts, UA 09/18/2016 PRESENT   Final  .  Specimen Description 09/18/2016 URINE, CATHETERIZED   Final  . Special Requests 09/18/2016 NONE   Final  . Culture 09/18/2016 *  Final                   Value:>=100,000 COLONIES/mL ESCHERICHIA COLI Confirmed Extended Spectrum Beta-Lactamase Producer (ESBL) Performed at [facility], [place] [address] [place], [place] [address]   . Report Status 09/18/2016 09/21/2016 FINAL   Final  . Organism ID, Bacteria 09/18/2016 ESCHERICHIA COLI*  Final  . Opiates 09/18/2016 NONE DETECTED  NONE DETECTED Final  . Cocaine 09/18/2016 NONE DETECTED  NONE DETECTED Final  . Benzodiazepines 09/18/2016 POSITIVE* NONE DETECTED Final  . Amphetamines 09/18/2016 NONE DETECTED  NONE DETECTED Final  . Tetrahydrocannabinol 09/18/2016 NONE DETECTED  NONE DETECTED Final  . Barbiturates 09/18/2016 NONE DETECTED  NONE DETECTED Final   Comment:        DRUG SCREEN FOR MEDICAL PURPOSES ONLY.  IF CONFIRMATION IS NEEDED FOR ANY PURPOSE, NOTIFY LAB WITHIN 5 DAYS.        LOWEST DETECTABLE LIMITS FOR URINE DRUG SCREEN Drug Class       Cutoff (ng/mL) Amphetamine      1000 Barbiturate      200 Benzodiazepine   673 Tricyclics       419 Opiates          300 Cocaine          300 THC              50   . Sodium 09/18/2016 139  135 - 145 mmol/L Final  . Potassium 09/18/2016 2.8* 3.5 - 5.1 mmol/L Final  . Chloride 09/18/2016 101  101 - 111 mmol/L Final  . CO2 09/18/2016 25  22 - 32 mmol/L Final  . Glucose, Bld 09/18/2016 82  65 - 99 mg/dL Final  . BUN 09/18/2016 15  6 - 20 mg/dL Final  . Creatinine, Ser 09/18/2016 0.73  0.44 - 1.00 mg/dL Final  .  Calcium 09/18/2016 8.5* 8.9 - 10.3 mg/dL Final  . Total Protein 09/18/2016 6.3* 6.5 - 8.1 g/dL Final  . Albumin 09/18/2016 3.1* 3.5 - 5.0 g/dL Final  . AST 09/18/2016 22  15 - 41 U/L Final  . ALT 09/18/2016 9* 14 - 54 U/L Final  . Alkaline Phosphatase 09/18/2016 107  38 - 126 U/L Final  . Total Bilirubin 09/18/2016 1.2  0.3 - 1.2 mg/dL Final  . GFR calc non Af Amer  09/18/2016 >60  >60 mL/min Final  . GFR calc Af Amer 09/18/2016 >60  >60 mL/min Final   Comment: (NOTE) The eGFR has been calculated using the CKD EPI equation. This calculation has not been validated in all clinical situations. eGFR's persistently <60 mL/min signify possible Chronic Kidney Disease.   . Anion gap 09/18/2016 13  5 - 15 Final  . Acetaminophen (Tylenol), Serum 09/18/2016 <10* 10 - 30 ug/mL Final   Comment:        THERAPEUTIC CONCENTRATIONS VARY SIGNIFICANTLY. A RANGE OF 10-30 ug/mL MAY BE AN EFFECTIVE CONCENTRATION FOR MANY PATIENTS. HOWEVER, SOME ARE BEST TREATED AT CONCENTRATIONS OUTSIDE THIS RANGE. ACETAMINOPHEN CONCENTRATIONS >150 ug/mL AT 4 HOURS AFTER INGESTION AND >50 ug/mL AT 12 HOURS AFTER INGESTION ARE OFTEN ASSOCIATED WITH TOXIC REACTIONS.   Marland Kitchen Alcohol, Ethyl (B) 09/18/2016 <5  <5 mg/dL Final   Comment:        LOWEST DETECTABLE LIMIT FOR SERUM ALCOHOL IS 5 mg/dL FOR MEDICAL PURPOSES ONLY   . Salicylate Lvl 70/96/2836 <7.0  2.8 - 30.0 mg/dL Final  . Troponin I 09/18/2016 <0.03  <0.03 ng/mL Final  . Lactic Acid, Venous 09/18/2016 0.8  0.5 - 1.9 mmol/L Final  . Lactic Acid, Venous 09/18/2016 1.3  0.5 - 1.9 mmol/L Final  . WBC 09/18/2016 9.1  4.0 - 10.5 K/uL Final  . RBC 09/18/2016 4.21  3.87 - 5.11 MIL/uL Final  . Hemoglobin 09/18/2016 12.1  12.0 - 15.0 g/dL Final  . HCT 09/18/2016 35.8* 36.0 - 46.0 % Final  . MCV 09/18/2016 85.0  78.0 - 100.0 fL Final  . MCH 09/18/2016 28.7  26.0 - 34.0 pg Final  . MCHC 09/18/2016 33.8  30.0 - 36.0 g/dL Final  . RDW 09/18/2016 14.2  11.5 - 15.5 % Final  . Platelets 09/18/2016 288  150 - 400 K/uL Final  . Neutrophils Relative % 09/18/2016 86  % Final  . Neutro Abs 09/18/2016 7.9* 1.7 - 7.7 K/uL Final  . Lymphocytes Relative 09/18/2016 10  % Final  . Lymphs Abs 09/18/2016 0.9  0.7 - 4.0 K/uL Final  . Monocytes Relative 09/18/2016 4  % Final  . Monocytes Absolute 09/18/2016 0.3  0.1 - 1.0 K/uL Final  . Eosinophils  Relative 09/18/2016 0  % Final  . Eosinophils Absolute 09/18/2016 0.0  0.0 - 0.7 K/uL Final  . Basophils Relative 09/18/2016 0  % Final  . Basophils Absolute 09/18/2016 0.0  0.0 - 0.1 K/uL Final  . Total CK 09/18/2016 238* 38 - 234 U/L Final  . Total hemoglobin 09/18/2016 11.6* 12.0 - 16.0 g/dL Final  . O2 Saturation 09/18/2016 96.4  % Final  . Carboxyhemoglobin 09/18/2016 2.2* 0.5 - 1.5 % Final  . Methemoglobin 09/18/2016 0.4  0.0 - 1.5 % Final  . Total oxygen content 09/18/2016 15.5  15.0 - 23.0 mL/dL Final  . Prothrombin Time 09/18/2016 12.8  11.4 - 15.2 seconds Final  . INR 09/18/2016 0.96   Final  . Magnesium 09/18/2016 1.7  1.7 - 2.4 mg/dL  Final  . WBC 09/19/2016 9.0  4.0 - 10.5 K/uL Final  . RBC 09/19/2016 3.86* 3.87 - 5.11 MIL/uL Final  . Hemoglobin 09/19/2016 11.1* 12.0 - 15.0 g/dL Final  . HCT 09/19/2016 33.3* 36.0 - 46.0 % Final  . MCV 09/19/2016 86.3  78.0 - 100.0 fL Final  . MCH 09/19/2016 28.8  26.0 - 34.0 pg Final  . MCHC 09/19/2016 33.3  30.0 - 36.0 g/dL Final  . RDW 09/19/2016 14.6  11.5 - 15.5 % Final  . Platelets 09/19/2016 254  150 - 400 K/uL Final  . Sodium 09/19/2016 137  135 - 145 mmol/L Final  . Potassium 09/19/2016 2.7* 3.5 - 5.1 mmol/L Final   Comment: CRITICAL RESULT CALLED TO, READ BACK BY AND VERIFIED WITH: DANIEL,C AT 7:00AM ON 09/19/16 BY FESTERMAN,C   . Chloride 09/19/2016 103  101 - 111 mmol/L Final  . CO2 09/19/2016 26  22 - 32 mmol/L Final  . Glucose, Bld 09/19/2016 116* 65 - 99 mg/dL Final  . BUN 09/19/2016 15  6 - 20 mg/dL Final  . Creatinine, Ser 09/19/2016 0.80  0.44 - 1.00 mg/dL Final  . Calcium 09/19/2016 7.7* 8.9 - 10.3 mg/dL Final  . Total Protein 09/19/2016 5.3* 6.5 - 8.1 g/dL Final  . Albumin 09/19/2016 2.6* 3.5 - 5.0 g/dL Final  . AST 09/19/2016 20  15 - 41 U/L Final  . ALT 09/19/2016 10* 14 - 54 U/L Final  . Alkaline Phosphatase 09/19/2016 93  38 - 126 U/L Final  . Total Bilirubin 09/19/2016 0.7  0.3 - 1.2 mg/dL Final  . GFR calc  non Af Amer 09/19/2016 >60  >60 mL/min Final  . GFR calc Af Amer 09/19/2016 >60  >60 mL/min Final   Comment: (NOTE) The eGFR has been calculated using the CKD EPI equation. This calculation has not been validated in all clinical situations. eGFR's persistently <60 mL/min signify possible Chronic Kidney Disease.   . Anion gap 09/19/2016 8  5 - 15 Final  . Magnesium 09/19/2016 1.5* 1.7 - 2.4 mg/dL Final  . Potassium 09/19/2016 3.7  3.5 - 5.1 mmol/L Final  . Sodium 09/20/2016 139  135 - 145 mmol/L Final  . Potassium 09/20/2016 3.3* 3.5 - 5.1 mmol/L Final  . Chloride 09/20/2016 109  101 - 111 mmol/L Final  . CO2 09/20/2016 25  22 - 32 mmol/L Final  . Glucose, Bld 09/20/2016 125* 65 - 99 mg/dL Final  . BUN 09/20/2016 8  6 - 20 mg/dL Final  . Creatinine, Ser 09/20/2016 0.66  0.44 - 1.00 mg/dL Final  . Calcium 09/20/2016 8.1* 8.9 - 10.3 mg/dL Final  . GFR calc non Af Amer 09/20/2016 >60  >60 mL/min Final  . GFR calc Af Amer 09/20/2016 >60  >60 mL/min Final   Comment: (NOTE) The eGFR has been calculated using the CKD EPI equation. This calculation has not been validated in all clinical situations. eGFR's persistently <60 mL/min signify possible Chronic Kidney Disease.   . Anion gap 09/20/2016 5  5 - 15 Final  . Carboxyhemoglobin 09/20/2016 3.1* 0.5 - 1.5 % Final   Comment: CRITICAL RESULT CALLED TO, READ BACK BY AND VERIFIED WITH: Estevan Oaks RN, AT [phone] BY WENDY VIA, RRT,RCP ON 09/20/2016   . Magnesium 09/21/2016 1.8  1.7 - 2.4 mg/dL Final  . Magnesium 09/22/2016 2.0  1.7 - 2.4 mg/dL Final  . Sodium 09/22/2016 134* 135 - 145 mmol/L Final  . Potassium 09/22/2016 5.6* 3.5 - 5.1 mmol/L Final  . Chloride  09/22/2016 105  101 - 111 mmol/L Final  . CO2 09/22/2016 24  22 - 32 mmol/L Final  . Glucose, Bld 09/22/2016 104* 65 - 99 mg/dL Final  . BUN 09/22/2016 7  6 - 20 mg/dL Final  . Creatinine, Ser 09/22/2016 0.97  0.44 - 1.00 mg/dL Final  . Calcium 09/22/2016 8.5* 8.9 - 10.3 mg/dL Final    . GFR calc non Af Amer 09/22/2016 54* >60 mL/min Final  . GFR calc Af Amer 09/22/2016 >60  >60 mL/min Final   Comment: (NOTE) The eGFR has been calculated using the CKD EPI equation. This calculation has not been validated in all clinical situations. eGFR's persistently <60 mL/min signify possible Chronic Kidney Disease.   . Anion gap 09/22/2016 5  5 - 15 Final    Dg Chest 2 View  Result Date: 09/18/2016 CLINICAL DATA:  Altered mental status. EXAM: CHEST  2 VIEW COMPARISON:  09/08/2016 . FINDINGS: Mediastinum hilar structures normal. COPD . Lungs are clear. Cardiomegaly with normal pulmonary vascularity. Stable mild elevation left hemidiaphragm. No acute bony abnormality. IMPRESSION: 1.  COPD.  No focal infiltrate. 2.  Cardiomegaly.  Normal pulmonary vascularity. Electronically Signed   By: Marcello Moores  Register   On: 09/18/2016 14:40   Dg Chest 2 View  Result Date: 09/08/2016 CLINICAL DATA:  Right anterior chest pain and right clavicular pain after a fall. EXAM: CHEST  2 VIEW COMPARISON:  07/16/2015 FINDINGS: Shallow inspiration with linear atelectasis or fibrosis in the lung bases. Mild cardiac enlargement. No vascular congestion or edema. Emphysematous changes in the lungs. No focal consolidation. No blunting of costophrenic angles. No pneumothorax. Calcified and tortuous aorta. Displaced fracture of the distal right clavicle. IMPRESSION: Emphysematous changes in the lungs. Shallow inspiration with linear atelectasis or fibrosis in the lung bases. Mild cardiac enlargement. No focal airspace disease. Fracture right clavicle. Electronically Signed   By: Lucienne Capers M.D.   On: 09/08/2016 03:37   Dg Clavicle Right  Result Date: 09/08/2016 CLINICAL DATA:  Right clavicular pain after a fall. EXAM: RIGHT CLAVICLE - 2+ VIEWS COMPARISON:  None. FINDINGS: Comminuted fractures of the mid and distal shaft of the right clavicle with inferior displacement of butterfly fragment. Acromioclavicular space is  preserved. No focal bone lesion or bone destruction. Right lateral rib deformities consistent with right rib fractures. IMPRESSION: Comminuted fractures of the mid and distal right clavicle. Nondisplaced right rib fractures. Electronically Signed   By: Lucienne Capers M.D.   On: 09/08/2016 03:39   Ct Head Wo Contrast  Result Date: 09/18/2016 CLINICAL DATA:  Altered mental status. EXAM: CT HEAD WITHOUT CONTRAST TECHNIQUE: Contiguous axial images were obtained from the base of the skull through the vertex without intravenous contrast. COMPARISON:  Cervical spine CT dated 04/06/2014. FINDINGS: Brain: Diffusely enlarged ventricles and subarachnoid spaces. Patchy white matter low density in both cerebral hemispheres. Old left caudate head lacunar infarct. No intracranial hemorrhage, mass lesion or CT evidence of acute infarction. Vascular: No hyperdense vessel or unexpected calcification. Skull: Normal. Negative for fracture or focal lesion. Sinuses/Orbits: Minimal bilateral maxillary sinus mucosal thickening. Unremarkable orbits. Other: None. IMPRESSION: No acute abnormality. Moderate diffuse cerebral and cerebellar atrophy, old left caudate lacunar infarct and extensive chronic small vessel white matter ischemic changes in both cerebral hemispheres. Electronically Signed   By: Claudie Revering M.D.   On: 09/18/2016 14:47     Assessment/Plan   ICD-9-CM ICD-10-CM   1. Continuous tobacco abuse 305.1 Z72.0   2. Physical deconditioning 799.3 R53.81  3. FTT (failure to thrive) in adult 783.7 R62.7   4. Electrolyte disturbance 276.9 E87.8   5. Closed displaced fracture of right clavicle, unspecified part of clavicle, initial encounter 810.00 S42.001A    routine healing  6. PAF (paroxysmal atrial fibrillation) (HCC) 427.31 I48.0   7. Atherosclerosis of native artery of lower extremity with ulceration, unspecified laterality, unspecified ulceration site ([place]) 440.23 I70.25    707.10    8. Severe protein-calorie  malnutrition ([place]) 262 E43   9. GERD without esophagitis 530.81 K21.9   10. Dementia without behavioral disturbance, unspecified dementia type 294.20 F03.90   11. Depression with anxiety 300.4 F41.8   12. Essential hypertension 401.9 I10      Complete bactrim tx as ordered. Continue isolation until abx finished  Nicotine patch 78m/hr TD daily x 6 weeks-->196mhr x 2 weeks-->30m2mr x 2 weeks and stop  F/u with ortho as indicated  Check BMP and Mg level  Cont other med as ordered  Wound care as ordered  Check BMP and CBC  PT/OT/ST as ordered  Cont nutritional supplements as ordered  GOAL: short term rehab then long term care Communicated with pt and nursing.  Will follow  Cortlin Marano S. CarPerlie [facility] [address]e[place]C [phone] (Monday-Friday 8 AM - 5 PM) ([phone] 5 PM and follow prompts

## 2016-09-27 DIAGNOSIS — Z720 Tobacco use: Secondary | ICD-10-CM | POA: Insufficient documentation

## 2016-09-27 DIAGNOSIS — F0390 Unspecified dementia without behavioral disturbance: Secondary | ICD-10-CM | POA: Insufficient documentation

## 2016-09-27 DIAGNOSIS — E46 Unspecified protein-calorie malnutrition: Secondary | ICD-10-CM | POA: Insufficient documentation

## 2016-09-27 DIAGNOSIS — F418 Other specified anxiety disorders: Secondary | ICD-10-CM | POA: Insufficient documentation

## 2016-09-27 DIAGNOSIS — K219 Gastro-esophageal reflux disease without esophagitis: Secondary | ICD-10-CM | POA: Insufficient documentation

## 2016-09-27 DIAGNOSIS — I480 Paroxysmal atrial fibrillation: Secondary | ICD-10-CM | POA: Insufficient documentation

## 2016-09-27 DIAGNOSIS — R627 Adult failure to thrive: Secondary | ICD-10-CM | POA: Insufficient documentation

## 2016-09-27 LAB — BASIC METABOLIC PANEL
BUN: 28 mg/dL — AB (ref 4–21)
CREATININE: 1.1 mg/dL (ref 0.5–1.1)
Glucose: 68 mg/dL
POTASSIUM: 5.3 mmol/L (ref 3.4–5.3)
SODIUM: 139 mmol/L (ref 137–147)

## 2016-09-28 ENCOUNTER — Non-Acute Institutional Stay (SKILLED_NURSING_FACILITY): Payer: Medicare Other | Admitting: Adult Health

## 2016-09-28 DIAGNOSIS — L97322 Non-pressure chronic ulcer of left ankle with fat layer exposed: Secondary | ICD-10-CM

## 2016-10-03 ENCOUNTER — Other Ambulatory Visit: Payer: Self-pay | Admitting: *Deleted

## 2016-10-03 MED ORDER — ALPRAZOLAM 0.5 MG PO TABS: 0.5000 mg | ORAL_TABLET | Freq: Every day | ORAL | 5 refills | Status: AC

## 2016-10-03 NOTE — Telephone Encounter (Signed)
[facility] #: [phone] Fax#: [phone]  

## 2016-10-18 ENCOUNTER — Encounter: Payer: Self-pay | Admitting: Vascular Surgery

## 2016-10-25 ENCOUNTER — Inpatient Hospital Stay (HOSPITAL_COMMUNITY): Admission: RE | Admit: 2016-10-25 | Payer: Medicare Other | Source: Ambulatory Visit

## 2016-10-25 ENCOUNTER — Ambulatory Visit: Payer: Medicare Other | Admitting: Vascular Surgery

## 2016-10-27 ENCOUNTER — Non-Acute Institutional Stay (SKILLED_NURSING_FACILITY): Payer: Medicare Other | Admitting: Adult Health

## 2016-10-27 ENCOUNTER — Encounter: Payer: Self-pay | Admitting: Adult Health

## 2016-10-27 DIAGNOSIS — F418 Other specified anxiety disorders: Secondary | ICD-10-CM | POA: Diagnosis not present

## 2016-10-27 DIAGNOSIS — I480 Paroxysmal atrial fibrillation: Secondary | ICD-10-CM | POA: Diagnosis not present

## 2016-10-27 DIAGNOSIS — I7025 Atherosclerosis of native arteries of other extremities with ulceration: Secondary | ICD-10-CM | POA: Diagnosis not present

## 2016-10-27 DIAGNOSIS — L97322 Non-pressure chronic ulcer of left ankle with fat layer exposed: Secondary | ICD-10-CM

## 2016-10-27 DIAGNOSIS — I10 Essential (primary) hypertension: Secondary | ICD-10-CM | POA: Diagnosis not present

## 2016-10-27 NOTE — Progress Notes (Signed)
Location:   [facility] Room Number: 108 A Place of Service:  SNF (31)   CODE STATUS: DNR  No Known Allergies  Chief Complaint  Patient presents with  . Medical Management of Chronic Issues     HPI:  She is a short term rehab patient of this facility being seen for the management of her chronic illnesses. Overall her status is stable. She continues to participate with therapy.  She is unable to fully participate in the hpi or ros; but did tell me that she is feeling good. There are no nursing concerns at this time.    Past Medical History:  Diagnosis Date  . Arthritis   . Atherosclerotic PVD with ulceration (HCC)   . Chronic venous insufficiency   . Dementia   . Tobacco use   . Ulcer of lower limb [facility](HCC)     Past Surgical History:  Procedure Laterality Date  . ABDOMINAL AORTAGRAM N/A 04/06/2014   Procedure: ABDOMINAL Ronny FlurryAORTAGRAM;  Surgeon: Chuck Hinthristopher S Dickson, MD;  [facility];  Service: Cardiovascular;  Laterality: N/A;  . ABDOMINAL HYSTERECTOMY    . FRACTURE SURGERY     L hip, L wrist    Social History   Social History  . Marital status: Widowed    Spouse name: N/A  . Number of children: N/A  . Years of education: N/A   Occupational History  . Not on file.   Social History Main Topics  . Smoking status: Current Every Day Smoker    Packs/day: 1.00    Years: 50.00    Types: Cigarettes  . Smokeless tobacco: Never Used  . Alcohol use No  . Drug use: No  . Sexual activity: Not on file   Other Topics Concern  . Not on file   Social History Narrative  . No narrative on file   Family History  Problem Relation Age of Onset  . Cancer Mother   . Heart disease Mother   . Cancer Father   . Cancer Daughter   . Heart disease Brother     before age 81      VITAL SIGNS BP (!) 99/52   Pulse 72   Temp 97.3 F (36.3 C)   Resp 18   Ht 5' (1.524 m)   Wt 100 lb 6.4 oz (45.5 kg)   SpO2 97% Comment: room air  BMI 19.61 kg/m    Patient's Medications  New Prescriptions   No medications on file  Previous Medications   ALPRAZOLAM (XANAX) 0.5 MG TABLET    Take 1 tablet (0.5 mg total) by mouth at bedtime.   AMLODIPINE (NORVASC) 5 MG TABLET    Take 5 mg by mouth daily. Reported on 03/01/2016   BENAZEPRIL (LOTENSIN) 10 MG TABLET    Take 10 mg by mouth daily.   CALCIUM-PHOSPHORUS-VITAMIN D (CALCIUM GUMMIES PO)    Take 1 tablet by mouth at bedtime.   CHOLECALCIFEROL (VITAMIN D) 1000 UNITS TABLET    Take 1,000 Units by mouth daily.   CITALOPRAM (CELEXA) 20 MG TABLET    Take 20 mg by mouth daily. Reported on 03/01/2016   DONEPEZIL (ARICEPT) 10 MG TABLET    Take 10 mg by mouth at bedtime.   FUROSEMIDE (LASIX) 20 MG TABLET    Take 1 tablet by mouth daily.   MIRTAZAPINE (REMERON) 15 MG TABLET    Take 7.5 mg by mouth at bedtime.    MULTIPLE VITAMINS-MINERALS (MULTIVITAMIN ADULT PO)    Take  1 tablet by mouth daily.   RANITIDINE (ZANTAC) 300 MG TABLET    Take 300 mg by mouth daily.  Modified Medications   No medications on file  Discontinued Medications   No medications on file     SIGNIFICANT DIAGNOSTIC EXAMS   LABS REVIEWED:   09-27-16: glucose 68; bun 28.4; creat 1.07; k+ 5.3; na++ 139 mag 2.1     Review of Systems  Unable to perform ROS: Dementia     Physical Exam  Constitutional: She appears well-developed.  Frail appearing,  Eyes: Pupils are equal, round, and reactive to light. No scleral icterus.  Neck: Neck supple. Carotid bruit is not present. No tracheal deviation present. No thyromegaly present.  Cardiovascular: Normal rate, regular rhythm and intact distal pulses.  Exam reveals no gallop and no friction rub.   Murmur (1/6 SEM) heard. Pulmonary/Chest: Effort normal and breath sounds normal. No respiratory distress. She has no wheezes.  Abdominal: Soft. Bowel sounds are normal. She exhibits no distension and no mass. There is no hepatomegaly. There is no tenderness. There is no rebound and no guarding.   Musculoskeletal: She exhibits no edema Lymphadenopathy:    She has no cervical adenopathy.  Neurological: She is alert.  Skin: Skin is warm and dry. No rash noted.  B/l LE with hyperpigmentation Right heel: 2 x 2 cm yellow wound bed: santyl  Psychiatric: She has a normal mood and affect. Her behavior is normal.      ASSESSMENT/ PLAN:  1. Hypertension: norvasc 5 mg daily lotensin 10 mg daily  2. afib: heart rate is stable will monitor   3. gerd: will continue zantac 300 mg daily  4. Dementia: will continue aricept 10 mg nightly   5. Right heel ulceration: will continue current treatment and will monitor  6. Depression with anxiety: will continue celexa 20 mg daily xanax 0.5 mg nightly and remeron 7.5 mg nightly to help with appetite  7. PVD: will continue lasix 20 mg daily to help manage edema and will monitor     MD is aware of resident's narcotic use and is in agreement with current plan of care. We will attempt to wean resident as apropriate   Synthia Innocent NP [facility]  Contact [phone] Monday through Friday 8am- 5pm  After hours call [phone]

## 2016-10-28 LAB — BASIC METABOLIC PANEL
BUN: 34 mg/dL — AB (ref 4–21)
CREATININE: 0.8 mg/dL (ref 0.5–1.1)
Glucose: 90 mg/dL
POTASSIUM: 4.9 mmol/L (ref 3.4–5.3)
SODIUM: 141 mmol/L (ref 137–147)

## 2016-10-30 NOTE — Progress Notes (Signed)
[facility]    Place of Service:  SNF (31)   CODE STATUS: dnr  No Known Allergies  Chief Complaint  Patient presents with  . Acute Visit    wound management     HPI:  I have been asked to review her left inner ankle wound there are no signs of infection present. There are no reports of fever present. She is not complaining pain that this time.   Past Medical History:  Diagnosis Date  . Arthritis   . Atherosclerotic PVD with ulceration (HCC)   . Chronic venous insufficiency   . Dementia   . Tobacco use   . Ulcer of lower limb [facility](HCC)     Past Surgical History:  Procedure Laterality Date  . ABDOMINAL AORTAGRAM N/A 04/06/2014   Procedure: ABDOMINAL Ronny FlurryAORTAGRAM;  Surgeon: Chuck Hinthristopher S Dickson, MD;  [facility];  Service: Cardiovascular;  Laterality: N/A;  . ABDOMINAL HYSTERECTOMY    . FRACTURE SURGERY     L hip, L wrist    Social History   Social History  . Marital status: Widowed    Spouse name: N/A  . Number of children: N/A  . Years of education: N/A   Occupational History  . Not on file.   Social History Main Topics  . Smoking status: Current Every Day Smoker    Packs/day: 1.00    Years: 50.00    Types: Cigarettes  . Smokeless tobacco: Never Used  . Alcohol use No  . Drug use: No  . Sexual activity: Not on file   Other Topics Concern  . Not on file   Social History Narrative  . No narrative on file   Family History  Problem Relation Age of Onset  . Cancer Mother   . Heart disease Mother   . Cancer Father   . Cancer Daughter   . Heart disease Brother     before age 81      VITAL SIGNS BP (!) 99/52   Pulse 72   Temp 97.3 F (36.3 C)   Resp 18   Ht 5\' 5"  (1.651 m)   Wt 94 lb 14.4 oz (43 kg)   SpO2 97%   BMI 15.79 kg/m   Patient's Medications  New Prescriptions   No medications on file  Previous Medications   ALPRAZOLAM (XANAX) 0.5 MG TABLET    Take 1 tablet (0.5 mg total) by mouth at bedtime.   AMLODIPINE (NORVASC) 5 MG TABLET    Take 5 mg by mouth daily. Reported on 03/01/2016   BENAZEPRIL (LOTENSIN) 10 MG TABLET    Take 10 mg by mouth daily.   CALCIUM-PHOSPHORUS-VITAMIN D (CALCIUM GUMMIES PO)    Take 1 tablet by mouth at bedtime.   CHOLECALCIFEROL (VITAMIN D) 1000 UNITS TABLET    Take 1,000 Units by mouth daily.   CITALOPRAM (CELEXA) 20 MG TABLET    Take 20 mg by mouth daily. Reported on 03/01/2016   DONEPEZIL (ARICEPT) 10 MG TABLET    Take 10 mg by mouth at bedtime.   FUROSEMIDE (LASIX) 20 MG TABLET    Take 1 tablet by mouth daily.   MIRTAZAPINE (REMERON) 15 MG TABLET    Take 7.5 mg by mouth at bedtime.    MULTIPLE VITAMINS-MINERALS (MULTIVITAMIN ADULT PO)    Take 1 tablet by mouth daily.   RANITIDINE (ZANTAC) 300 MG TABLET    Take 300 mg by mouth daily.  Modified Medications   No medications  on file  Discontinued Medications   No medications on file     SIGNIFICANT DIAGNOSTIC EXAMS  09-18-16: chest x-ray: 1.  COPD.  No focal infiltrate. 2.  Cardiomegaly.  Normal pulmonary vascularity.  09-18-16: ct of head: No acute abnormality. Moderate diffuse cerebral and cerebellar atrophy, old left caudate lacunar infarct and extensive chronic small vessel white matter ischemic changes in both cerebral hemispheres.    LABS REVIEWED:   09-19-16: wbc 9.0; hgb 11.0; hct 33.3; mcv 86.3; plt 254; glucose 116; bun 15; creat 0.8; k+ 2.7; na++ 137; liver normal albumin 2.6; mag 1.5 09-22-16: glucose 104; bun 7; creat 0.97; k+ 5.6; na++ 134      Review of Systems  Unable to perform ROS: Dementia   Physical Exam  Constitutional: No distress.  Frail   Eyes: Conjunctivae are normal.  Neck: Neck supple. No JVD present. No thyromegaly present.  Cardiovascular: Normal rate, regular rhythm and intact distal pulses.   Murmur heard. 1/6  Respiratory: Effort normal and breath sounds normal. No respiratory distress. She has no wheezes.  GI: Soft. Bowel sounds are normal. She exhibits no distension.  There is no tenderness.  Musculoskeletal: She exhibits no edema.  Able to move all extremities   Lymphadenopathy:    She has no cervical adenopathy.  Neurological: She is alert.  Skin: Skin is warm and dry. She is not diaphoretic.  Bilateral lower extremities discolored Left inner ankle slough present no signs of infection present   Psychiatric: She has a normal mood and affect.     ASSESSMENT/ PLAN:  1. Left inner ankle ulceration: will use santyl to wound bed daily and will monitor her status.    MD is aware of resident's narcotic use and is in agreement with current plan of care. We will attempt to wean resident as apropriate   Synthia Innocent NP [facility]  Contact [phone] Monday through Friday 8am- 5pm  After hours call [phone]

## 2016-11-01 DIAGNOSIS — S42021A Displaced fracture of shaft of right clavicle, initial encounter for closed fracture: Secondary | ICD-10-CM | POA: Diagnosis not present

## 2016-11-15 ENCOUNTER — Non-Acute Institutional Stay (SKILLED_NURSING_FACILITY): Payer: Medicare Other | Admitting: Internal Medicine

## 2016-11-15 ENCOUNTER — Encounter: Payer: Self-pay | Admitting: Internal Medicine

## 2016-11-15 DIAGNOSIS — R197 Diarrhea, unspecified: Secondary | ICD-10-CM | POA: Diagnosis not present

## 2016-11-15 NOTE — Progress Notes (Signed)
Patient ID: THEIA DEZEEUW, female   DOB: [birth year], 83 y.o.   MRN: [id number]     This is a nursing facility follow up for specific acute issue of loose stool  Interim medical record and care since last [facility] visit was updated with review of diagnostic studies and change in clinical status since last visit were documented.  HPI: The patient believes that the stools are watery, yellow  related to specific foods which are soft or pured. She denies association with milk or dairy or with wheat products. She describes chronic abdominal discomfort intermittently which is stable in severity and frequency. Her daughter feels that this condition preceded her admission to the nursing facility. Her daughter has irritable bowel syndrome and feels her mother has similar symptoms. Patient has never had a colonoscopy.  She is on no antibiotics stool softeners, laxatives as per the med list. Medical history is positive for dementia, COPD, peripheral vascular disease with ulceration .As part of the PVD evaluation she has had an abdominal aortogram.  The patient states she now smokes approximately 1 cigarette a day. Her daughter states she was smoking 1.5 packs a day prior to admission to the facility.  Review of systems: The patient does have cold intolerance. There is no history of thyroid disease.  Constitutional: No fever,significant weight change, fatigue .daughter feels she is gaining weight since she's been in the facility. Eyes: No redness, discharge, pain, vision change ENT/mouth: No nasal congestion,  purulent discharge, earache,change in hearing ,sore throat  Cardiovascular: No chest pain, palpitations,paroxysmal nocturnal dyspnea, claudication, edema  Respiratory: No cough, sputum production,hemoptysis, DOE , significant snoring,apnea  Gastrointestinal: No heartburn,dysphagia, nausea / vomiting,rectal bleeding, melena Genitourinary: No dysuria,hematuria, pyuria,   incontinence, nocturia Musculoskeletal: No joint stiffness, joint swelling, weakness,pain Dermatologic: No rash, pruritus, change in appearance of skin Neurologic: No dizziness,headache,syncope, seizures, numbness , tingling Psychiatric: No significant anxiety , depression, insomnia, anorexia Endocrine: No change in hair/skin/ nails, excessive thirst, excessive hunger, excessive urination  Hematologic/lymphatic: No significant bruising, lymphadenopathy,abnormal bleeding Allergy/immunology: No itchy/ watery eyes, significant sneezing, urticaria, angioedema  Physical exam:  Pertinent or positive findings: The patient's clothing reeks of tobacco odor. She has multiple missing teeth. Thyroid is small. Grade 1 systolic murmur across the base. Breath sounds are decreased. Chest essentially sits on her hips She has severe degenerative joint changes in the hands. Pedal pulses are decreased.   General appearance:Thin but adequately nourished; no acute distress , increased work of breathing is present.   Lymphatic: No lymphadenopathy about the head, neck, axilla . Eyes: No conjunctival inflammation or lid edema is present. There is no scleral icterus. Ears:  External ear exam shows no significant lesions or deformities.   Nose:  External nasal examination shows no deformity or inflammation. Nasal mucosa are pink and moist without lesions ,exudates Oral exam: lips and gums are healthy appearing.There is no oropharyngeal erythema or exudate . Neck:  No thyromegaly, masses, tenderness noted.    Heart:  Normal rate and regular rhythm. S1 and S2 normal without gallop, click, rub .  Lungs:Chest clear to auscultation without wheezes, rhonchi,rales , rubs. Abdomen:Bowel sounds are normal. Abdomen is soft and nontender with no organomegaly, hernias,masses. GU: deferred  Extremities:  No cyanosis, edema  Skin: Warm & dry w/o tenting. No significant lesions or rash.  #1 loose stool, probable IBS variant. Hx   does not suggest gluten or lactose intolerance. #2 cold intolerance most likely related to her body habitus.  Plan: Trial of probiotic.  If diarrhea persists, check thyroid function test.

## 2016-11-15 NOTE — Patient Instructions (Signed)
See assessment and plan acutely for this visit ? ?

## 2016-11-16 ENCOUNTER — Encounter: Payer: Self-pay | Admitting: Adult Health

## 2016-11-16 ENCOUNTER — Non-Acute Institutional Stay (SKILLED_NURSING_FACILITY): Payer: Medicare Other | Admitting: Adult Health

## 2016-11-16 DIAGNOSIS — I10 Essential (primary) hypertension: Secondary | ICD-10-CM | POA: Diagnosis not present

## 2016-11-16 DIAGNOSIS — I7025 Atherosclerosis of native arteries of other extremities with ulceration: Secondary | ICD-10-CM

## 2016-11-16 DIAGNOSIS — F0390 Unspecified dementia without behavioral disturbance: Secondary | ICD-10-CM | POA: Diagnosis not present

## 2016-11-16 DIAGNOSIS — E43 Unspecified severe protein-calorie malnutrition: Secondary | ICD-10-CM | POA: Diagnosis not present

## 2016-11-16 DIAGNOSIS — Z720 Tobacco use: Secondary | ICD-10-CM

## 2016-11-16 NOTE — Progress Notes (Signed)
Location:   [facility] Room Number: 108 Place of Service:  SNF (31)    CODE STATUS: DNR  No Known Allergies  Chief Complaint  Patient presents with  . Discharge Note    Discharging to Home    HPI:  She is being discharged to home with home health for pt/ot/rn. She will not need dme; she has all necessary equipment. She will need her prescriptions to be written and will need to follow up with her medical provider.  She had been hospitalized for uti; was admitted to this facility for short term rehab.    Past Medical History:  Diagnosis Date  . Arthritis   . Atherosclerotic PVD with ulceration (HCC)   . Chronic venous insufficiency   . Dementia   . Tobacco use   . Ulcer of lower limb [facility])     Past Surgical History:  Procedure Laterality Date  . ABDOMINAL AORTAGRAM N/A 04/06/2014   Procedure: ABDOMINAL Ronny Flurry;  Surgeon: Chuck Hint, MD;  Location: [facility] CATH LAB;  Service: Cardiovascular;  Laterality: N/A;  . ABDOMINAL HYSTERECTOMY    . FRACTURE SURGERY     L hip, L wrist    Social History   Social History  . Marital status: Widowed    Spouse name: N/A  . Number of children: N/A  . Years of education: N/A   Occupational History  . Not on file.   Social History Main Topics  . Smoking status: Current Every Day Smoker    Packs/day: 1.00    Years: 50.00    Types: Cigarettes  . Smokeless tobacco: Never Used  . Alcohol use No  . Drug use: No  . Sexual activity: Not on file   Other Topics Concern  . Not on file   Social History Narrative  . No narrative on file   Family History  Problem Relation Age of Onset  . Cancer Mother   . Heart disease Mother   . Cancer Father   . Cancer Daughter   . Heart disease Brother     before age 39    VITAL SIGNS BP (!) 110/51   Pulse 71   Temp 98.1 F (36.7 C)   Resp 16   Ht 5\' 5"  (1.651 m)   Wt 101 lb 3.2 oz (45.9 kg)   SpO2 97%   BMI 16.84 kg/m   Patient's Medications  New  Prescriptions   No medications on file  Previous Medications   ACETAMINOPHEN (TYLENOL) 325 MG TABLET    Take 650 mg by mouth 3 (three) times daily.   ALPRAZOLAM (XANAX) 0.5 MG TABLET    Take 1 tablet (0.5 mg total) by mouth at bedtime.   AMLODIPINE (NORVASC) 5 MG TABLET    Take 5 mg by mouth daily. Reported on 03/01/2016   BENAZEPRIL (LOTENSIN) 10 MG TABLET    Take 10 mg by mouth daily.   CALCIUM-PHOSPHORUS-VITAMIN D (CALCIUM GUMMIES PO)    Take 1 tablet by mouth at bedtime.   CHOLECALCIFEROL (VITAMIN D) 1000 UNITS TABLET    Take 1,000 Units by mouth daily.   CITALOPRAM (CELEXA) 20 MG TABLET    Take 20 mg by mouth daily. Reported on 03/01/2016   DONEPEZIL (ARICEPT) 10 MG TABLET    Take 10 mg by mouth at bedtime.   FUROSEMIDE (LASIX) 20 MG TABLET    Take 1 tablet by mouth daily.   MIRTAZAPINE (REMERON) 15 MG TABLET    Take 7.5 mg by mouth  at bedtime.    MULTIPLE VITAMINS-MINERALS (DECUBI-VITE) CAPS    Take 1 capsule by mouth daily.   RANITIDINE (ZANTAC) 300 MG TABLET    Take 300 mg by mouth daily.   SACCHAROMYCES BOULARDII (FLORASTOR) 250 MG CAPSULE    Take 250 mg by mouth 2 (two) times daily.  Modified Medications   No medications on file  Discontinued Medications   MULTIPLE VITAMINS-MINERALS (MULTIVITAMIN ADULT PO)    Take 1 tablet by mouth daily.     SIGNIFICANT DIAGNOSTIC EXAMS  09-18-16: chest x-ray: 1.  COPD.  No focal infiltrate. 2.  Cardiomegaly.  Normal pulmonary vascularity.  09-18-16: ct of head: No acute abnormality. Moderate diffuse cerebral and cerebellar atrophy, old left caudate lacunar infarct and extensive chronic small vessel white matter ischemic changes in both cerebral hemispheres.    LABS REVIEWED:   09-19-16: wbc 9.0; hgb 11.0; hct 33.3; mcv 86.3; plt 254; glucose 116; bun 15; creat 0.8; k+ 2.7; na++ 137; liver normal albumin 2.6; mag 1.5 09-22-16: glucose 104; bun 7; creat 0.97; k+ 5.6; na++ 134      Review of Systems  Unable to perform ROS: Dementia    Physical Exam  Constitutional: No distress.  Frail   Eyes: Conjunctivae are normal.  Neck: Neck supple. No JVD present. No thyromegaly present.  Cardiovascular: Normal rate, regular rhythm and intact distal pulses.   Murmur heard. 1/6  Respiratory: Effort normal and breath sounds normal. No respiratory distress. She has no wheezes.  GI: Soft. Bowel sounds are normal. She exhibits no distension. There is no tenderness.  Musculoskeletal: She exhibits no edema.  Able to move all extremities   Lymphadenopathy:    She has no cervical adenopathy.  Neurological: She is alert.  Skin: Skin is warm and dry. She is not diaphoretic.  Bilateral lower extremities discolored Left inner ankle slough present no signs of infection present   Psychiatric: She has a normal mood and affect.     ASSESSMENT/ PLAN:  Patient is being discharged with the following home health services:  Pt/ot/rn: to evaluate and treat as indicated for gait; balance strength adl training and medication management.   Patient is being discharged with the following durable medical equipment:  None required   Patient has been advised to f/u with their PCP in 1-2 weeks to bring them up to date on their rehab stay.  Social services at facility was responsible for arranging this appointment.  Pt was provided with a 30 day supply of prescriptions for medications and refills must be obtained from their PCP.  For controlled substances, a more limited supply may be provided adequate until PCP appointment only. #15 xanax 0.5 mg tabs   Time spent with patient  40  minutes >50% time spent counseling; reviewing medical record; tests; labs; and developing future plan of care    Synthia InnocentDeborah Rainier Feuerborn NP [facility]  Contact [phone] Monday through Friday 8am- 5pm  After hours call [phone]

## 2016-11-17 DIAGNOSIS — R4182 Altered mental status, unspecified: Secondary | ICD-10-CM | POA: Diagnosis not present

## 2016-11-17 DIAGNOSIS — I10 Essential (primary) hypertension: Secondary | ICD-10-CM | POA: Diagnosis not present

## 2016-11-17 DIAGNOSIS — S42001D Fracture of unspecified part of right clavicle, subsequent encounter for fracture with routine healing: Secondary | ICD-10-CM | POA: Diagnosis not present

## 2016-11-17 DIAGNOSIS — F329 Major depressive disorder, single episode, unspecified: Secondary | ICD-10-CM | POA: Diagnosis not present

## 2016-11-17 DIAGNOSIS — F0280 Dementia in other diseases classified elsewhere without behavioral disturbance: Secondary | ICD-10-CM | POA: Diagnosis not present

## 2016-11-23 DIAGNOSIS — I10 Essential (primary) hypertension: Secondary | ICD-10-CM | POA: Diagnosis not present

## 2016-11-23 DIAGNOSIS — F0280 Dementia in other diseases classified elsewhere without behavioral disturbance: Secondary | ICD-10-CM | POA: Diagnosis not present

## 2016-11-23 DIAGNOSIS — R4182 Altered mental status, unspecified: Secondary | ICD-10-CM | POA: Diagnosis not present

## 2016-11-23 DIAGNOSIS — S42001D Fracture of unspecified part of right clavicle, subsequent encounter for fracture with routine healing: Secondary | ICD-10-CM | POA: Diagnosis not present

## 2016-11-23 DIAGNOSIS — F329 Major depressive disorder, single episode, unspecified: Secondary | ICD-10-CM | POA: Diagnosis not present

## 2016-11-29 DIAGNOSIS — S42021D Displaced fracture of shaft of right clavicle, subsequent encounter for fracture with routine healing: Secondary | ICD-10-CM | POA: Diagnosis not present

## 2016-11-29 DIAGNOSIS — R4182 Altered mental status, unspecified: Secondary | ICD-10-CM | POA: Diagnosis not present

## 2016-11-29 DIAGNOSIS — S42001D Fracture of unspecified part of right clavicle, subsequent encounter for fracture with routine healing: Secondary | ICD-10-CM | POA: Diagnosis not present

## 2016-11-29 DIAGNOSIS — I10 Essential (primary) hypertension: Secondary | ICD-10-CM | POA: Diagnosis not present

## 2016-11-29 DIAGNOSIS — F329 Major depressive disorder, single episode, unspecified: Secondary | ICD-10-CM | POA: Diagnosis not present

## 2016-11-29 DIAGNOSIS — F0280 Dementia in other diseases classified elsewhere without behavioral disturbance: Secondary | ICD-10-CM | POA: Diagnosis not present

## 2016-11-30 DIAGNOSIS — F329 Major depressive disorder, single episode, unspecified: Secondary | ICD-10-CM | POA: Diagnosis not present

## 2016-11-30 DIAGNOSIS — R4182 Altered mental status, unspecified: Secondary | ICD-10-CM | POA: Diagnosis not present

## 2016-11-30 DIAGNOSIS — I10 Essential (primary) hypertension: Secondary | ICD-10-CM | POA: Diagnosis not present

## 2016-11-30 DIAGNOSIS — S42001D Fracture of unspecified part of right clavicle, subsequent encounter for fracture with routine healing: Secondary | ICD-10-CM | POA: Diagnosis not present

## 2016-11-30 DIAGNOSIS — F0280 Dementia in other diseases classified elsewhere without behavioral disturbance: Secondary | ICD-10-CM | POA: Diagnosis not present

## 2016-12-01 DIAGNOSIS — F0280 Dementia in other diseases classified elsewhere without behavioral disturbance: Secondary | ICD-10-CM | POA: Diagnosis not present

## 2016-12-01 DIAGNOSIS — I10 Essential (primary) hypertension: Secondary | ICD-10-CM | POA: Diagnosis not present

## 2016-12-01 DIAGNOSIS — F329 Major depressive disorder, single episode, unspecified: Secondary | ICD-10-CM | POA: Diagnosis not present

## 2016-12-01 DIAGNOSIS — S42001D Fracture of unspecified part of right clavicle, subsequent encounter for fracture with routine healing: Secondary | ICD-10-CM | POA: Diagnosis not present

## 2016-12-01 DIAGNOSIS — R4182 Altered mental status, unspecified: Secondary | ICD-10-CM | POA: Diagnosis not present

## 2016-12-04 ENCOUNTER — Encounter: Payer: Self-pay | Admitting: Vascular Surgery

## 2016-12-04 DIAGNOSIS — K580 Irritable bowel syndrome with diarrhea: Secondary | ICD-10-CM | POA: Diagnosis not present

## 2016-12-04 DIAGNOSIS — F0280 Dementia in other diseases classified elsewhere without behavioral disturbance: Secondary | ICD-10-CM | POA: Diagnosis not present

## 2016-12-04 DIAGNOSIS — I10 Essential (primary) hypertension: Secondary | ICD-10-CM | POA: Diagnosis not present

## 2016-12-04 DIAGNOSIS — E440 Moderate protein-calorie malnutrition: Secondary | ICD-10-CM | POA: Diagnosis not present

## 2016-12-05 DIAGNOSIS — F329 Major depressive disorder, single episode, unspecified: Secondary | ICD-10-CM | POA: Diagnosis not present

## 2016-12-05 DIAGNOSIS — F0280 Dementia in other diseases classified elsewhere without behavioral disturbance: Secondary | ICD-10-CM | POA: Diagnosis not present

## 2016-12-05 DIAGNOSIS — I10 Essential (primary) hypertension: Secondary | ICD-10-CM | POA: Diagnosis not present

## 2016-12-05 DIAGNOSIS — R4182 Altered mental status, unspecified: Secondary | ICD-10-CM | POA: Diagnosis not present

## 2016-12-05 DIAGNOSIS — S42001D Fracture of unspecified part of right clavicle, subsequent encounter for fracture with routine healing: Secondary | ICD-10-CM | POA: Diagnosis not present

## 2016-12-06 DIAGNOSIS — F0280 Dementia in other diseases classified elsewhere without behavioral disturbance: Secondary | ICD-10-CM | POA: Diagnosis not present

## 2016-12-06 DIAGNOSIS — R4182 Altered mental status, unspecified: Secondary | ICD-10-CM | POA: Diagnosis not present

## 2016-12-06 DIAGNOSIS — I10 Essential (primary) hypertension: Secondary | ICD-10-CM | POA: Diagnosis not present

## 2016-12-06 DIAGNOSIS — F329 Major depressive disorder, single episode, unspecified: Secondary | ICD-10-CM | POA: Diagnosis not present

## 2016-12-06 DIAGNOSIS — S42001D Fracture of unspecified part of right clavicle, subsequent encounter for fracture with routine healing: Secondary | ICD-10-CM | POA: Diagnosis not present

## 2016-12-07 DIAGNOSIS — R4182 Altered mental status, unspecified: Secondary | ICD-10-CM | POA: Diagnosis not present

## 2016-12-07 DIAGNOSIS — I10 Essential (primary) hypertension: Secondary | ICD-10-CM | POA: Diagnosis not present

## 2016-12-07 DIAGNOSIS — F329 Major depressive disorder, single episode, unspecified: Secondary | ICD-10-CM | POA: Diagnosis not present

## 2016-12-07 DIAGNOSIS — S42001D Fracture of unspecified part of right clavicle, subsequent encounter for fracture with routine healing: Secondary | ICD-10-CM | POA: Diagnosis not present

## 2016-12-07 DIAGNOSIS — F0280 Dementia in other diseases classified elsewhere without behavioral disturbance: Secondary | ICD-10-CM | POA: Diagnosis not present

## 2016-12-11 DIAGNOSIS — R4182 Altered mental status, unspecified: Secondary | ICD-10-CM | POA: Diagnosis not present

## 2016-12-11 DIAGNOSIS — I10 Essential (primary) hypertension: Secondary | ICD-10-CM | POA: Diagnosis not present

## 2016-12-11 DIAGNOSIS — F0280 Dementia in other diseases classified elsewhere without behavioral disturbance: Secondary | ICD-10-CM | POA: Diagnosis not present

## 2016-12-11 DIAGNOSIS — S42001D Fracture of unspecified part of right clavicle, subsequent encounter for fracture with routine healing: Secondary | ICD-10-CM | POA: Diagnosis not present

## 2016-12-11 DIAGNOSIS — F329 Major depressive disorder, single episode, unspecified: Secondary | ICD-10-CM | POA: Diagnosis not present

## 2016-12-12 DIAGNOSIS — F329 Major depressive disorder, single episode, unspecified: Secondary | ICD-10-CM | POA: Diagnosis not present

## 2016-12-12 DIAGNOSIS — F0280 Dementia in other diseases classified elsewhere without behavioral disturbance: Secondary | ICD-10-CM | POA: Diagnosis not present

## 2016-12-12 DIAGNOSIS — S42001D Fracture of unspecified part of right clavicle, subsequent encounter for fracture with routine healing: Secondary | ICD-10-CM | POA: Diagnosis not present

## 2016-12-12 DIAGNOSIS — I10 Essential (primary) hypertension: Secondary | ICD-10-CM | POA: Diagnosis not present

## 2016-12-12 DIAGNOSIS — R4182 Altered mental status, unspecified: Secondary | ICD-10-CM | POA: Diagnosis not present

## 2016-12-13 ENCOUNTER — Encounter (HOSPITAL_COMMUNITY): Payer: Medicare Other

## 2016-12-13 ENCOUNTER — Ambulatory Visit: Payer: Medicare Other | Admitting: Vascular Surgery

## 2016-12-13 DIAGNOSIS — S42001D Fracture of unspecified part of right clavicle, subsequent encounter for fracture with routine healing: Secondary | ICD-10-CM | POA: Diagnosis not present

## 2016-12-13 DIAGNOSIS — F0280 Dementia in other diseases classified elsewhere without behavioral disturbance: Secondary | ICD-10-CM | POA: Diagnosis not present

## 2016-12-13 DIAGNOSIS — F329 Major depressive disorder, single episode, unspecified: Secondary | ICD-10-CM | POA: Diagnosis not present

## 2016-12-13 DIAGNOSIS — R4182 Altered mental status, unspecified: Secondary | ICD-10-CM | POA: Diagnosis not present

## 2016-12-13 DIAGNOSIS — I10 Essential (primary) hypertension: Secondary | ICD-10-CM | POA: Diagnosis not present

## 2016-12-21 DIAGNOSIS — R001 Bradycardia, unspecified: Secondary | ICD-10-CM | POA: Diagnosis not present

## 2016-12-21 DIAGNOSIS — Z23 Encounter for immunization: Secondary | ICD-10-CM | POA: Diagnosis not present

## 2016-12-21 DIAGNOSIS — R197 Diarrhea, unspecified: Secondary | ICD-10-CM | POA: Diagnosis not present

## 2016-12-21 DIAGNOSIS — R42 Dizziness and giddiness: Secondary | ICD-10-CM | POA: Diagnosis not present

## 2016-12-21 DIAGNOSIS — R6889 Other general symptoms and signs: Secondary | ICD-10-CM | POA: Diagnosis not present

## 2016-12-21 DIAGNOSIS — Z8719 Personal history of other diseases of the digestive system: Secondary | ICD-10-CM | POA: Diagnosis not present

## 2016-12-22 ENCOUNTER — Other Ambulatory Visit: Payer: Self-pay | Admitting: Adult Health

## 2016-12-26 ENCOUNTER — Emergency Department (HOSPITAL_COMMUNITY)
Admission: EM | Admit: 2016-12-26 | Discharge: 2016-12-26 | Disposition: A | Discharge status: Home / Self Care | Payer: Medicare Other | Attending: Emergency Medicine | Admitting: Emergency Medicine

## 2016-12-26 ENCOUNTER — Encounter (HOSPITAL_COMMUNITY): Payer: Self-pay | Admitting: Emergency Medicine

## 2016-12-26 DIAGNOSIS — Z79899 Other long term (current) drug therapy: Secondary | ICD-10-CM | POA: Diagnosis not present

## 2016-12-26 DIAGNOSIS — I10 Essential (primary) hypertension: Secondary | ICD-10-CM | POA: Diagnosis not present

## 2016-12-26 DIAGNOSIS — R197 Diarrhea, unspecified: Secondary | ICD-10-CM | POA: Diagnosis not present

## 2016-12-26 DIAGNOSIS — A0472 Enterocolitis due to Clostridium difficile, not specified as recurrent: Secondary | ICD-10-CM

## 2016-12-26 DIAGNOSIS — F17210 Nicotine dependence, cigarettes, uncomplicated: Secondary | ICD-10-CM | POA: Insufficient documentation

## 2016-12-26 LAB — BASIC METABOLIC PANEL
Anion gap: 8 (ref 5–15)
BUN: 52 mg/dL — AB (ref 6–20)
CHLORIDE: 108 mmol/L (ref 101–111)
CO2: 20 mmol/L — ABNORMAL LOW (ref 22–32)
Calcium: 9 mg/dL (ref 8.9–10.3)
Creatinine, Ser: 1.75 mg/dL — ABNORMAL HIGH (ref 0.44–1.00)
GFR calc Af Amer: 31 mL/min — ABNORMAL LOW (ref >60)
GFR calc non Af Amer: 26 mL/min — ABNORMAL LOW (ref >60)
GLUCOSE: 88 mg/dL (ref 65–99)
POTASSIUM: 4.6 mmol/L (ref 3.5–5.1)
SODIUM: 136 mmol/L (ref 135–145)

## 2016-12-26 LAB — CBC
HEMATOCRIT: 34.6 % — AB (ref 36.0–46.0)
Hemoglobin: 11.2 g/dL — ABNORMAL LOW (ref 12.0–15.0)
MCH: 28.7 pg (ref 26.0–34.0)
MCHC: 32.4 g/dL (ref 30.0–36.0)
MCV: 88.7 fL (ref 78.0–100.0)
Platelets: 202 10*3/uL (ref 150–400)
RBC: 3.9 MIL/uL (ref 3.87–5.11)
RDW: 15.4 % (ref 11.5–15.5)
WBC: 7.4 10*3/uL (ref 4.0–10.5)

## 2016-12-26 MED ORDER — METRONIDAZOLE 500 MG PO TABS
500.0000 mg | ORAL_TABLET | Freq: Once | ORAL | Status: AC
  Administered 2016-12-26: 500 mg via ORAL
  Filled 2016-12-26: qty 1

## 2016-12-26 MED ORDER — METRONIDAZOLE 500 MG PO TABS: 500.0000 mg | ORAL_TABLET | Freq: Three times a day (TID) | ORAL | 0 refills | Status: AC

## 2016-12-26 MED ORDER — ONDANSETRON HCL 4 MG PO TABS: 4.0000 mg | ORAL_TABLET | Freq: Three times a day (TID) | ORAL | 0 refills | Status: AC | PRN

## 2016-12-26 NOTE — Discharge Instructions (Addendum)
Please take all of your antibiotics until finished!   You may develop abdominal discomfort or diarrhea from the antibiotic.  You may help offset this with probiotics which you can buy or get in yogurt. Do not eat  or take the probiotics until 2 hours after your antibiotic.   Take zofran as needed for nausea. Return to the ED if concerning symptoms develop.

## 2016-12-26 NOTE — ED Provider Notes (Signed)
[facility]-EMERGENCY DEPT Provider Note   CSN: [id number] Arrival date & time: 12/26/16  1250     History   Chief Complaint Chief Complaint  Patient presents with  . Diarrhea    HPI Yolanda Martin is a 83 y.o. female accompanied by her daughter with history of dementia, venous insufficiency, and GERD presents today with chief complaint chronic intermittent diarrhea. She states she has had diarrhea for several years and has been diagnosed with C. difficile in the past and treated for it in the hospital 2 years ago. She states she will have diarrhea and constipation intermittently, mostly diarrhea. She has 3-4 loose stools daily. Stool is currently "mucousy", nonbloody, and watery. Greasy foods tend to make it worse. She does take probiotics. She had 1 episode of NBNB Vomiting with nausea and lightheadedness several days ago, but symptoms have not returned. She complains of intermittent generalized achy abdominal pain that does not radiate, and states "I have aches all over all the time" which is chronic and unchanged for her. Denies dysuria, hematuria, or melena. She is incontinent, which makes her prone to UTIs. She was treated with Macrobid for UTI 1.5 weeks ago. Her primary care obtained stool studies which resulted positively for C. difficile today and she recommended she present to the emergency room for further evaluation. She was also informed that she was dehydrated, and was told to stop taking her Lasix for some time.  Denies fever, chills, CP, SOB, dysuria, hematuria, LOC/syncope.   The history is provided by the patient and a relative.    Past Medical History:  Diagnosis Date  . Arthritis   . Atherosclerotic PVD with ulceration (HCC)   . Chronic venous insufficiency   . Dementia   . Tobacco use   . Ulcer of lower limb [facility])     Patient Active Problem List   Diagnosis Date Noted  . Protein-calorie malnutrition (HCC) 09/27/2016  . GERD without esophagitis 09/27/2016  . FTT  (failure to thrive) in adult 09/27/2016  . Continuous tobacco abuse 09/27/2016  . PAF (paroxysmal atrial fibrillation) (HCC) 09/27/2016  . Dementia without behavioral disturbance 09/27/2016  . Depression with anxiety 09/27/2016  . DNR (do not resuscitate) discussion   . Hypokalemia 09/19/2016  . Urinary tract infection without hematuria   . Palliative care encounter   . Goals of care, counseling/discussion   . Altered mental status 09/18/2016  . Fall 09/18/2016  . Essential hypertension, benign 07/01/2014  . Ankle ulcer, left, with fat layer exposed (HCC) 03/11/2014  . Atherosclerotic PVD with ulceration (HCC) 03/11/2014    Past Surgical History:  Procedure Laterality Date  . ABDOMINAL AORTAGRAM N/A 04/06/2014   Procedure: ABDOMINAL Ronny Flurry;  Surgeon: Chuck Hint, MD;  Location: [facility] CATH LAB;  Service: Cardiovascular;  Laterality: N/A;  . ABDOMINAL HYSTERECTOMY    . FRACTURE SURGERY     L hip, L wrist    OB History    No data available       Home Medications    Prior to Admission medications   Medication Sig Start Date End Date Taking? Authorizing Provider  acetaminophen (TYLENOL) 325 MG tablet Take 650 mg by mouth 3 (three) times daily.    Historical Provider, MD  ALPRAZolam Prudy Feeler) 0.5 MG tablet Take 1 tablet (0.5 mg total) by mouth at bedtime. 10/03/16   Kimber Relic, MD  amLODipine (NORVASC) 5 MG tablet Take 5 mg by mouth daily. Reported on 03/01/2016 02/21/15   Historical Provider, MD  benazepril (LOTENSIN)  10 MG tablet Take 10 mg by mouth daily. 02/21/15   Historical Provider, MD  Calcium-Phosphorus-Vitamin D (CALCIUM GUMMIES PO) Take 1 tablet by mouth at bedtime.    Historical Provider, MD  cholecalciferol (VITAMIN D) 1000 UNITS tablet Take 1,000 Units by mouth daily.    Historical Provider, MD  citalopram (CELEXA) 20 MG tablet Take 20 mg by mouth daily. Reported on 03/01/2016 02/19/15   Historical Provider, MD  donepezil (ARICEPT) 10 MG tablet Take 10 mg by mouth  at bedtime.    Historical Provider, MD  furosemide (LASIX) 20 MG tablet Take 1 tablet by mouth daily. 05/10/14   Historical Provider, MD  metroNIDAZOLE (FLAGYL) 500 MG tablet Take 1 tablet (500 mg total) by mouth 3 (three) times daily. 12/26/16 01/09/17  Niko Penson A Keneisha Heckart, PA-C  mirtazapine (REMERON) 15 MG tablet Take 7.5 mg by mouth at bedtime.  02/10/16   Historical Provider, MD  Multiple Vitamins-Minerals (DECUBI-VITE) CAPS Take 1 capsule by mouth daily.    Historical Provider, MD  ondansetron (ZOFRAN) 4 MG tablet Take 1 tablet (4 mg total) by mouth every 8 (eight) hours as needed for nausea or vomiting. 12/26/16   Brendalyn Vallely A Clemons Salvucci, PA-C  ranitidine (ZANTAC) 300 MG tablet Take 300 mg by mouth daily. 04/16/15   Historical Provider, MD  saccharomyces boulardii (FLORASTOR) 250 MG capsule Take 250 mg by mouth 2 (two) times daily.    Historical Provider, MD    Family History Family History  Problem Relation Age of Onset  . Cancer Mother   . Heart disease Mother   . Cancer Father   . Cancer Daughter   . Heart disease Brother     before age 81    Social History Social History  Substance Use Topics  . Smoking status: Current Every Day Smoker    Packs/day: 1.00    Years: 50.00    Types: Cigarettes  . Smokeless tobacco: Never Used  . Alcohol use No     Allergies   Patient has no known allergies.   Review of Systems Review of Systems  Constitutional: Positive for chills. Negative for fever.  Respiratory: Negative for shortness of breath.   Cardiovascular: Negative for chest pain.  Gastrointestinal: Positive for abdominal pain, diarrhea, nausea and vomiting. Negative for blood in stool.  Genitourinary: Negative for dysuria and hematuria.  Musculoskeletal: Negative for back pain and neck pain.  Neurological: Negative for dizziness, syncope and headaches.     Physical Exam Updated Vital Signs BP 110/62   Pulse (!) 52   Temp 97.5 F (36.4 C) (Oral)   Resp 19   SpO2 98%   Physical Exam    Constitutional: She appears well-developed. No distress.  HENT:  Head: Normocephalic and atraumatic.  Eyes: Conjunctivae and EOM are normal. Pupils are equal, round, and reactive to light. Right eye exhibits no discharge. Left eye exhibits no discharge. No scleral icterus.  Neck: Neck supple. No JVD present. No tracheal deviation present.  Cardiovascular: Normal rate, regular rhythm, normal heart sounds and intact distal pulses.   No murmur heard. 2+ radial and DP/PT pulses bl, negative Homan's bl   Pulmonary/Chest: Effort normal and breath sounds normal. No respiratory distress. She exhibits no tenderness.  Abdominal: Soft. Bowel sounds are normal. She exhibits no distension. There is no tenderness. There is no guarding.  Negative Murphy's, no tenderness to palpation at McBurney's point, negative Rovsing's, negative psoas sign.  Genitourinary:  Genitourinary Comments: No CVA tenderness  Musculoskeletal: She exhibits no edema or  tenderness.  Neurological: She is alert.  Oriented to person and place, but not time. Fluent speech, no facial droop, sensation intact.   Skin: Skin is warm and dry. Capillary refill takes less than 2 seconds. She is not diaphoretic.  Psychiatric: She has a normal mood and affect. Her behavior is normal.  Nursing note and vitals reviewed.    ED Treatments / Results  Labs (all labs ordered are listed, but only abnormal results are displayed) Labs Reviewed  BASIC METABOLIC PANEL - Abnormal; Notable for the following:       Result Value   CO2 20 (*)    BUN 52 (*)    Creatinine, Ser 1.75 (*)    GFR calc non Af Amer 26 (*)    GFR calc Af Amer 31 (*)    All other components within normal limits  CBC - Abnormal; Notable for the following:    Hemoglobin 11.2 (*)    HCT 34.6 (*)    All other components within normal limits    EKG  EKG Interpretation None       Radiology No results found.  Procedures Procedures (including critical care  time)  Medications Ordered in ED Medications  metroNIDAZOLE (FLAGYL) tablet 500 mg (500 mg Oral Given 12/26/16 1459)     Initial Impression / Assessment and Plan / ED Course  I have reviewed the triage vital signs and the nursing notes.  Pertinent labs & imaging results that were available during my care of the patient were reviewed by me and considered in my medical decision making (see chart for details).   Patient with confirmed C. Difficile. Afebrile, abdominal examination unremarkable.  No leukocytosis, vital signs at baseline.  Patient tolerating PO.   3:11 PM  Spoke with patient's PCP Dr. Leavy Cella, who states that as long as patient is able to tolerate food, She may be managed outpatient with Flagyl.    First dose Flagyl given in ED. Zofran given for nausea when necessary.  Rx for remainder of Flagyl given.  Recommended use of probiotics while on antibiotic,  Advised against taking alcohol while on Flagyl. Discussed close follow-up with PCP with patient and daughter.  Recommend follow-up within 1 week.  Discussed strict ED return precautions. Pt and daughter verbalized understanding of and agreement with plan and pt is safe for discharge home at this time.  Final Clinical Impressions(s) / ED Diagnoses   Final diagnoses:  C. difficile diarrhea    New Prescriptions New Prescriptions   METRONIDAZOLE (FLAGYL) 500 MG TABLET    Take 1 tablet (500 mg total) by mouth 3 (three) times daily.   ONDANSETRON (ZOFRAN) 4 MG TABLET    Take 1 tablet (4 mg total) by mouth every 8 (eight) hours as needed for nausea or vomiting.     Jeanie Sewer, PA-C 12/26/16 2037    Nira Conn, MD 12/29/16 [phone]

## 2016-12-26 NOTE — ED Triage Notes (Signed)
Pt arrives to ED with her daughter and states her PCP recently tested her stool and diagnosed her with C-Diff daughter states she has been having loose stools since December. Pt reports 4 loose stools in the last 24 hrs.

## 2017-01-12 ENCOUNTER — Encounter: Payer: Self-pay | Admitting: Physician Assistant

## 2017-01-25 ENCOUNTER — Ambulatory Visit: Payer: Medicare Other | Admitting: Physician Assistant

## 2017-01-29 ENCOUNTER — Encounter: Payer: Self-pay | Admitting: Vascular Surgery

## 2017-01-31 ENCOUNTER — Ambulatory Visit: Payer: Medicare Other | Admitting: Vascular Surgery

## 2017-01-31 ENCOUNTER — Ambulatory Visit (HOSPITAL_COMMUNITY): Admission: RE | Admit: 2017-01-31 | Payer: Medicare Other | Source: Ambulatory Visit

## 2017-01-31 DIAGNOSIS — R5383 Other fatigue: Secondary | ICD-10-CM | POA: Diagnosis not present

## 2017-01-31 DIAGNOSIS — R4182 Altered mental status, unspecified: Secondary | ICD-10-CM | POA: Diagnosis not present

## 2017-01-31 DIAGNOSIS — R319 Hematuria, unspecified: Secondary | ICD-10-CM | POA: Diagnosis not present

## 2017-01-31 DIAGNOSIS — I10 Essential (primary) hypertension: Secondary | ICD-10-CM | POA: Diagnosis not present

## 2017-01-31 DIAGNOSIS — Z5181 Encounter for therapeutic drug level monitoring: Secondary | ICD-10-CM | POA: Diagnosis not present

## 2017-02-06 DIAGNOSIS — R5383 Other fatigue: Secondary | ICD-10-CM | POA: Diagnosis not present

## 2017-02-06 DIAGNOSIS — R4182 Altered mental status, unspecified: Secondary | ICD-10-CM | POA: Diagnosis not present

## 2017-02-06 DIAGNOSIS — F0390 Unspecified dementia without behavioral disturbance: Secondary | ICD-10-CM | POA: Diagnosis not present

## 2017-02-08 DIAGNOSIS — I69321 Dysphasia following cerebral infarction: Secondary | ICD-10-CM | POA: Diagnosis not present

## 2017-02-08 DIAGNOSIS — F0390 Unspecified dementia without behavioral disturbance: Secondary | ICD-10-CM | POA: Diagnosis not present

## 2017-02-09 DIAGNOSIS — I639 Cerebral infarction, unspecified: Secondary | ICD-10-CM | POA: Diagnosis not present

## 2017-02-09 DIAGNOSIS — I69321 Dysphasia following cerebral infarction: Secondary | ICD-10-CM | POA: Diagnosis not present

## 2017-02-09 DIAGNOSIS — I6523 Occlusion and stenosis of bilateral carotid arteries: Secondary | ICD-10-CM | POA: Diagnosis not present

## 2017-02-09 DIAGNOSIS — F0390 Unspecified dementia without behavioral disturbance: Secondary | ICD-10-CM | POA: Diagnosis not present

## 2017-02-25 DIAGNOSIS — 419620001 Death: Secondary | SNOMED CT | POA: Diagnosis not present

## 2017-02-25 DEATH — deceased

## 2017-04-18 ENCOUNTER — Ambulatory Visit: Payer: Medicare Other | Admitting: Vascular Surgery

## 2017-04-18 ENCOUNTER — Encounter (HOSPITAL_COMMUNITY): Payer: Medicare Other

## 2017-10-23 IMAGING — CT CT HEAD W/O CM
3 series · 16 of 47 positions shown, 19 images · non-contrast
Comparison: Cervical spine CT dated 04/06/2014.

CLINICAL DATA: Altered mental status.

EXAM:
CT HEAD WITHOUT CONTRAST
TECHNIQUE: Contiguous axial images were obtained from the base of the skull
through the vertex without intravenous contrast.

[Series 2: head trauma wo · axial · 0.39mm/px · z∈[+116,+241]mm · 10 of 30 slices shown, 13 images]
[im 3/30  brain]
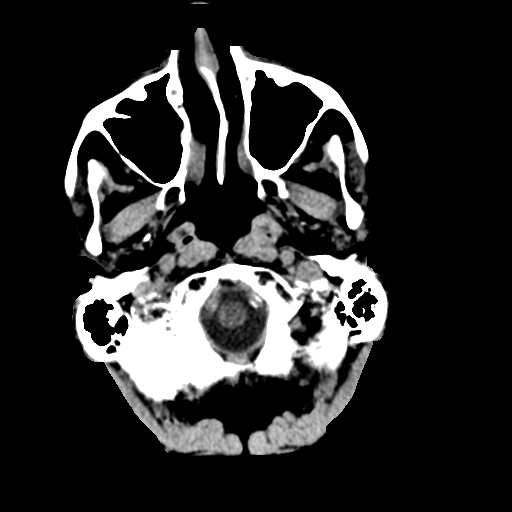
[im 3/30  bone]
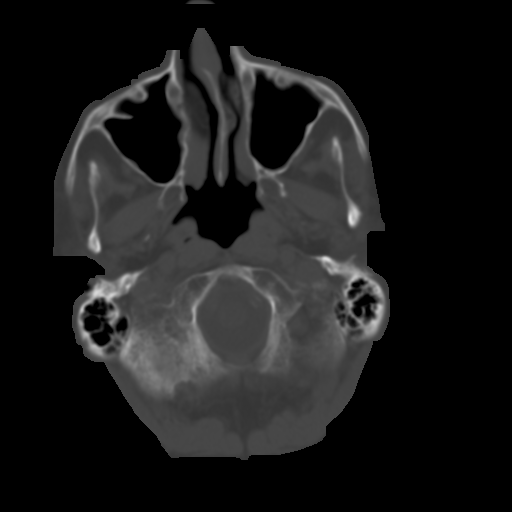
[im 6/30  brain]
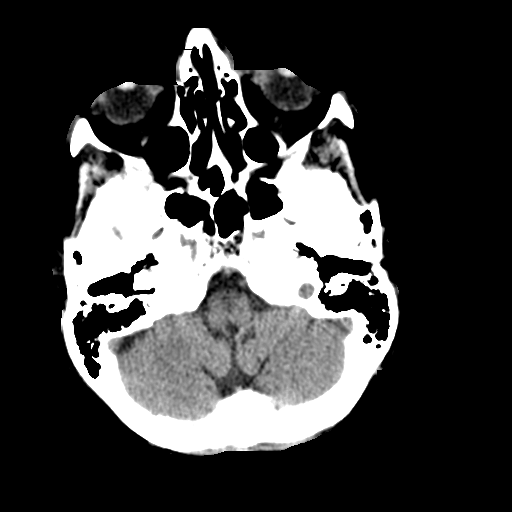
[im 9/30  brain]
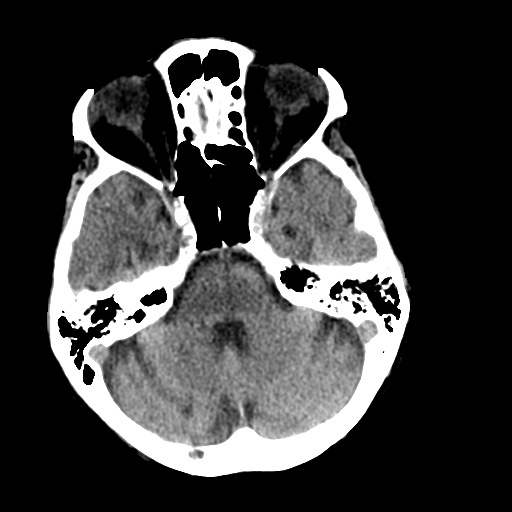
[im 11/30  brain]
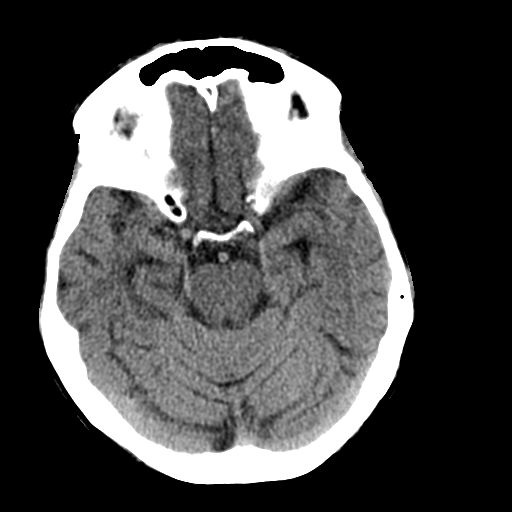
[im 14/30  brain]
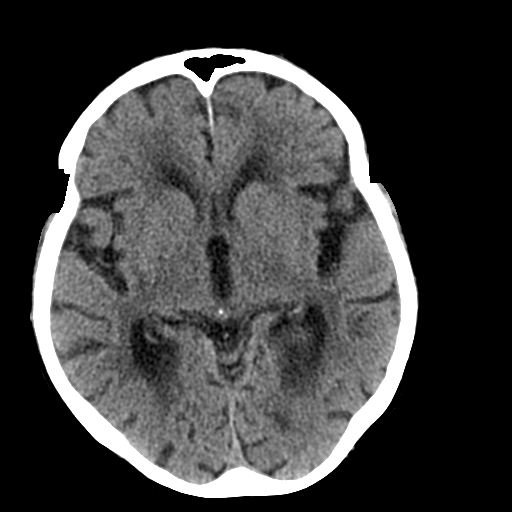
[im 14/30  bone]
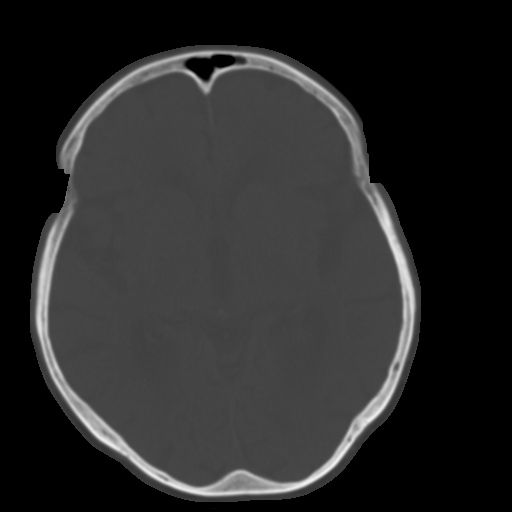
[im 17/30  brain]
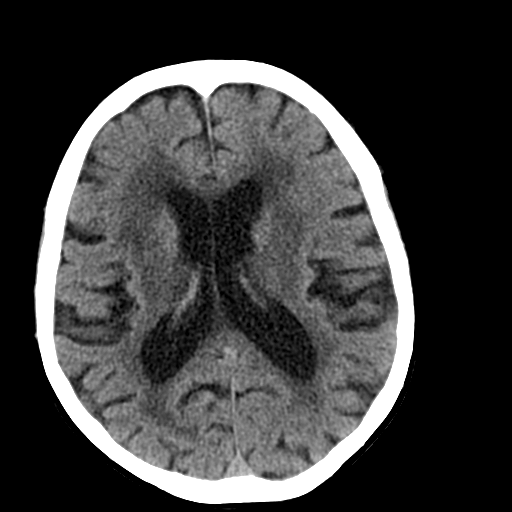
[im 20/30  brain]
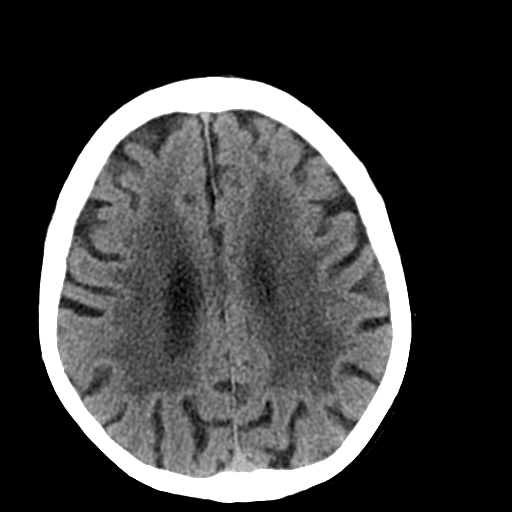
[im 23/30  brain]
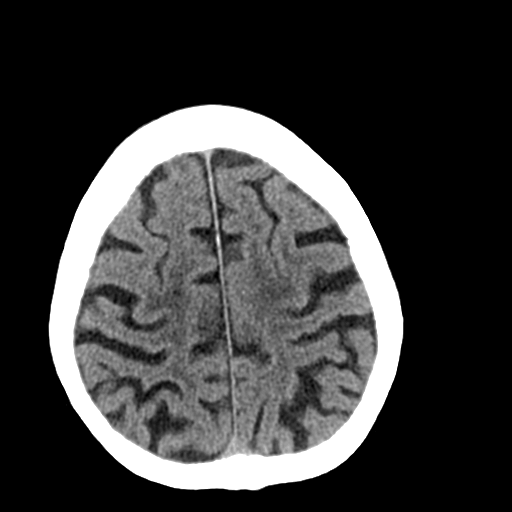
[im 25/30  brain]
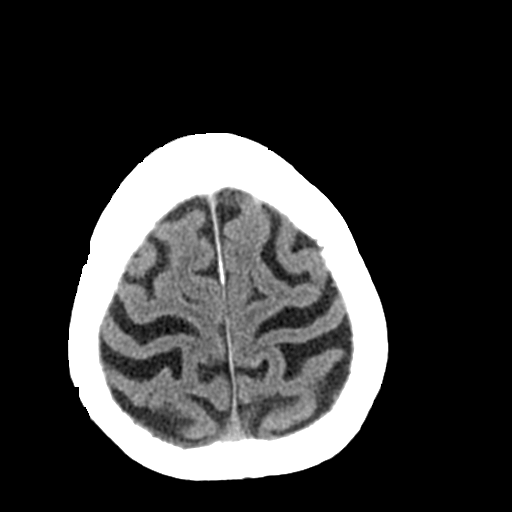
[im 25/30  bone]
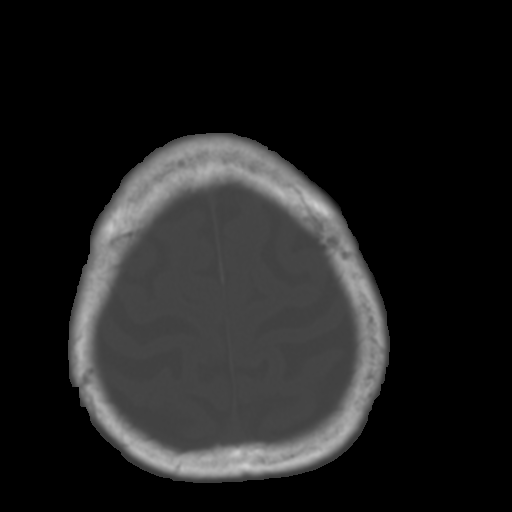
[im 28/30  brain]
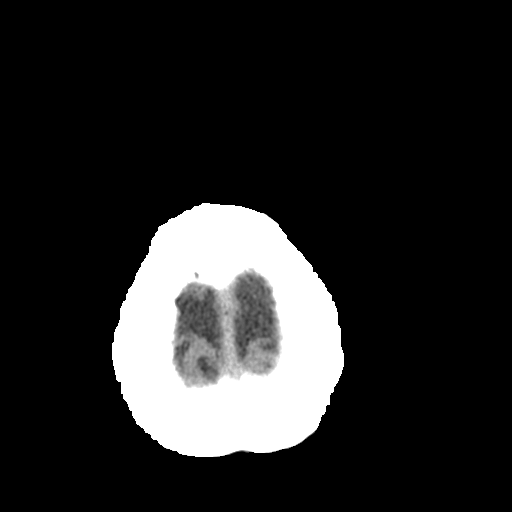

[Series 4: coronal soft tissue · coronal · 0.30mm/px · 3 of 64 slices shown]
[im 22/64  brain]
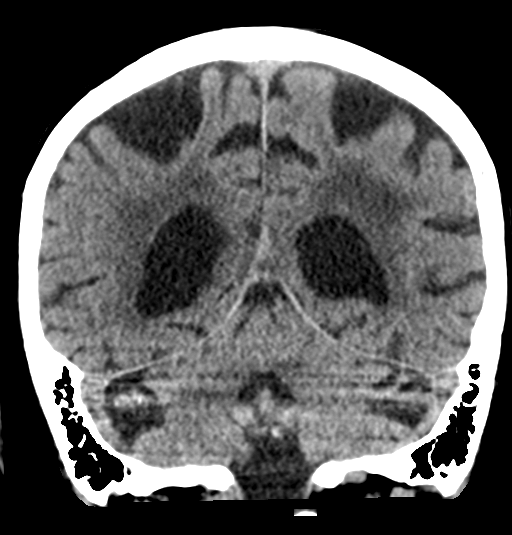
[im 29/64  brain]
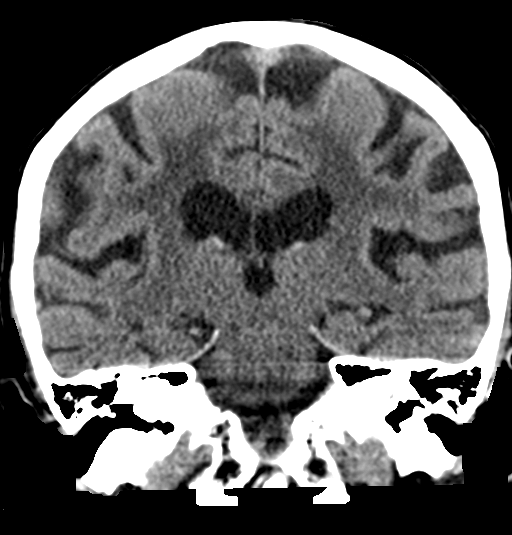
[im 36/64  brain]
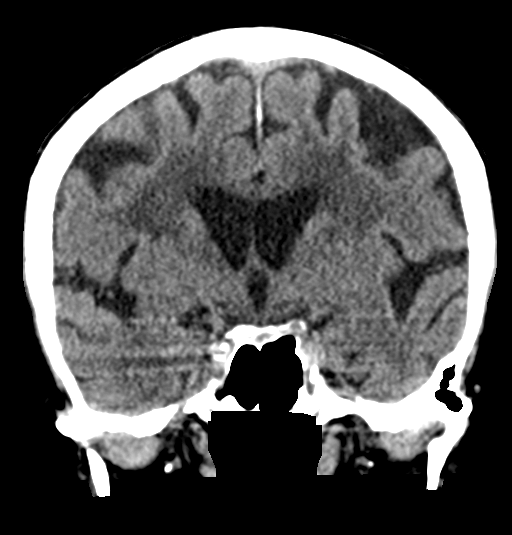

[Series 5: sagittal soft tissue · sagittal · 0.31mm/px · 3 of 55 slices shown]
[im 19/55  brain]
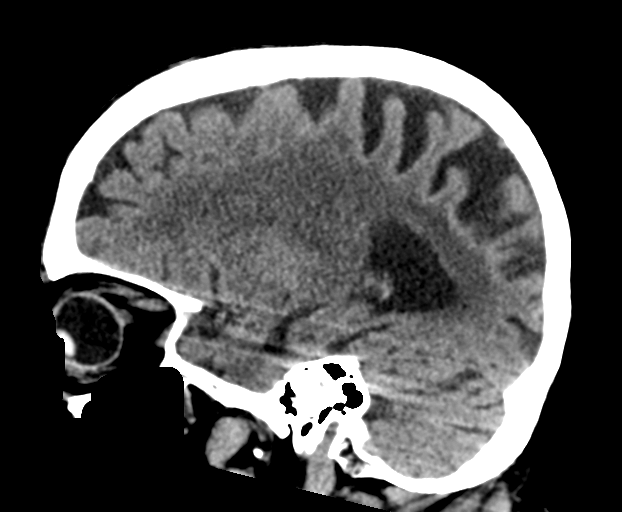
[im 28/55  brain]
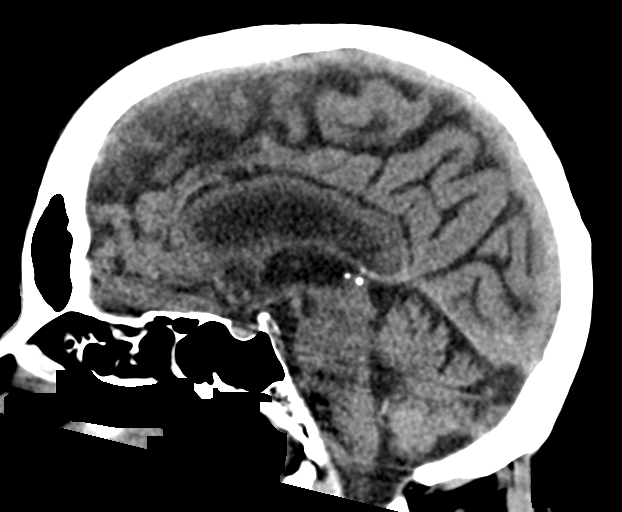
[im 37/55  brain]
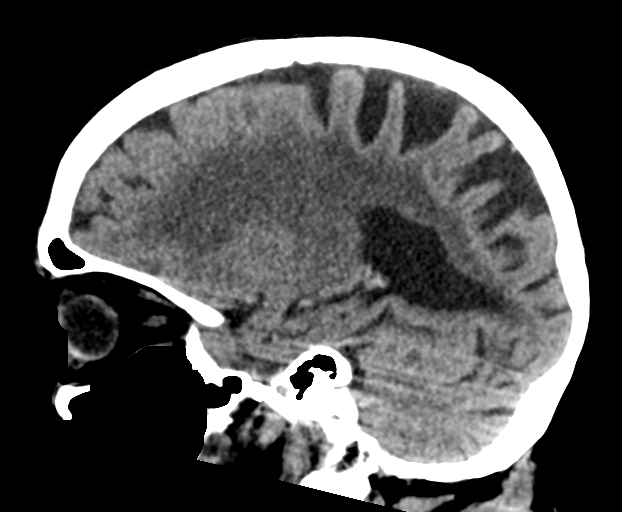

[16 of 47 positions shown; findings below may reference images not displayed]

FINDINGS: Brain: Diffusely enlarged ventricles and subarachnoid spaces. Patchy
white matter low density in both cerebral hemispheres. Old left
caudate head lacunar infarct. No intracranial hemorrhage, mass
lesion or CT evidence of acute infarction.

Vascular: No hyperdense vessel or unexpected calcification.

Skull: Normal. Negative for fracture or focal lesion.

Sinuses/Orbits: Minimal bilateral maxillary sinus mucosal
thickening. Unremarkable orbits.

Other: None.
IMPRESSION: No acute abnormality. Moderate diffuse cerebral and cerebellar
atrophy, old left caudate lacunar infarct and extensive chronic
small vessel white matter ischemic changes in both cerebral
hemispheres.
